# Patient Record
Sex: Female | Born: 1955 | Race: White | Hispanic: No | Marital: Single | Smoking: Former smoker
Health system: Southern US, Community
[De-identification: ages and names within clinical notes are randomized; demographics above are authoritative.]

## PROBLEM LIST (undated history)

## (undated) DIAGNOSIS — E785 Hyperlipidemia, unspecified: Secondary | ICD-10-CM

## (undated) DIAGNOSIS — M1990 Unspecified osteoarthritis, unspecified site: Secondary | ICD-10-CM

## (undated) DIAGNOSIS — E119 Type 2 diabetes mellitus without complications: Secondary | ICD-10-CM

## (undated) DIAGNOSIS — Z860100 Personal history of colon polyps, unspecified: Secondary | ICD-10-CM

## (undated) DIAGNOSIS — J45909 Unspecified asthma, uncomplicated: Secondary | ICD-10-CM

## (undated) DIAGNOSIS — E669 Obesity, unspecified: Secondary | ICD-10-CM

## (undated) HISTORY — PX: KNEE SURGERY: SHX244

## (undated) HISTORY — DX: Obesity, unspecified: E66.9

## (undated) HISTORY — PX: VEIN SURGERY: SHX48

## (undated) HISTORY — DX: Type 2 diabetes mellitus without complications: E11.9

## (undated) HISTORY — DX: Hyperlipidemia, unspecified: E78.5

## (undated) HISTORY — PX: LIPOSUCTION: SHX10

## (undated) HISTORY — PX: BREAST SURGERY: SHX581

## (undated) HISTORY — PX: EYE SURGERY: SHX253

## (undated) HISTORY — DX: Personal history of colon polyps, unspecified: Z86.0100

## (undated) HISTORY — DX: Personal history of colonic polyps: Z86.010

## (undated) HISTORY — PX: COLONOSCOPY: SHX174

---

## 1988-05-26 HISTORY — PX: BREAST EXCISIONAL BIOPSY: SUR124

## 2005-05-13 ENCOUNTER — Ambulatory Visit: Payer: Self-pay | Admitting: Unknown Physician Specialty

## 2006-05-26 HISTORY — PX: POLYPECTOMY: SHX149

## 2006-06-03 ENCOUNTER — Ambulatory Visit: Payer: Self-pay | Admitting: Gastroenterology

## 2006-06-04 ENCOUNTER — Ambulatory Visit: Payer: Self-pay | Admitting: Unknown Physician Specialty

## 2006-07-30 ENCOUNTER — Ambulatory Visit: Payer: Self-pay | Admitting: General Surgery

## 2006-07-30 HISTORY — PX: HEMORROIDECTOMY: SUR656

## 2008-10-04 ENCOUNTER — Ambulatory Visit: Payer: Self-pay | Admitting: Unknown Physician Specialty

## 2009-12-31 ENCOUNTER — Ambulatory Visit: Payer: Self-pay | Admitting: Unknown Physician Specialty

## 2010-01-01 ENCOUNTER — Ambulatory Visit: Payer: Self-pay | Admitting: Unknown Physician Specialty

## 2010-02-01 ENCOUNTER — Ambulatory Visit: Payer: Self-pay | Admitting: General Surgery

## 2010-02-04 LAB — PATHOLOGY REPORT

## 2010-07-22 ENCOUNTER — Ambulatory Visit: Payer: Self-pay

## 2010-12-08 ENCOUNTER — Emergency Department: Payer: Self-pay | Admitting: Emergency Medicine

## 2011-03-19 ENCOUNTER — Ambulatory Visit: Payer: Self-pay | Admitting: Unknown Physician Specialty

## 2012-12-03 ENCOUNTER — Encounter: Payer: Self-pay | Admitting: *Deleted

## 2013-04-13 ENCOUNTER — Encounter (INDEPENDENT_AMBULATORY_CARE_PROVIDER_SITE_OTHER): Payer: Self-pay | Admitting: General Surgery

## 2013-04-13 ENCOUNTER — Ambulatory Visit (INDEPENDENT_AMBULATORY_CARE_PROVIDER_SITE_OTHER): Payer: BC Managed Care – PPO | Admitting: General Surgery

## 2013-04-13 ENCOUNTER — Encounter (INDEPENDENT_AMBULATORY_CARE_PROVIDER_SITE_OTHER): Payer: Self-pay

## 2013-04-13 VITALS — BP 122/80 | HR 68 | Temp 97.6°F | Resp 16 | Ht 64.0 in | Wt 239.2 lb

## 2013-04-19 ENCOUNTER — Encounter (INDEPENDENT_AMBULATORY_CARE_PROVIDER_SITE_OTHER): Payer: Self-pay | Admitting: General Surgery

## 2013-04-19 NOTE — Progress Notes (Signed)
Patient ID: Charlotte Moses, female   DOB: 01/28/1956, 57 y.o.   MRN: [id number] (delayed note entry - taken from notes made during encounter and recollection)  Chief Complaint  Patient presents with  . Weight Loss Surgery    HPI Charlotte Moses is a 57 y.o. female.   HPI 57 year old morbidly obese Caucasian female referred by Dr. Vonita Moss for evaluation of weight loss surgery.The patient is specifically interested in laparoscopic adjustable gastric band surgery because it is the least invasive and reversible. 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 Despite numerous efforts to sustain weight loss she has been unsuccessful. Over the years she has tried Vickey Sages, Doylene Bode, and Weight Watchers all without any long-term success.  Her comorbidities include dyslipidemia, osteoarthritis of both knees, diabetes mellitus.  Past Medical History  Diagnosis Date  . Personal history of colonic polyps     Past Surgical History  Procedure Laterality Date  . Knee surgery Left   . Vein surgery    . Colonoscopy N/A 2008  . Polypectomy  2008    benign    History reviewed. No pertinent family history.  Social History History  Substance Use Topics  . Smoking status: Never Smoker   . Smokeless tobacco: Never Used  . Alcohol Use: Yes    Allergies  Allergen Reactions  . Latex Itching  . Codeine Rash    Current Outpatient Prescriptions  Medication Sig Dispense Refill  . FREESTYLE TEST STRIPS test strip       . KOMBIGLYZE XR 09-998 MG TB24       . pravastatin (PRAVACHOL) 40 MG tablet        No current facility-administered medications for this visit.    Review of Systems Review of Systems  Constitutional: Negative for fever, activity change, appetite change and unexpected weight change.  HENT: Negative for nosebleeds and trouble swallowing.   Eyes: Negative for photophobia and visual disturbance.  Respiratory:  Negative for chest tightness and shortness of breath.   Cardiovascular: Negative for chest pain and leg swelling.       Denies CP, SOB, orthopnea, PND, DOE; h/o elevated BP but BP now normal  Gastrointestinal: Negative for nausea, vomiting, abdominal pain, diarrhea and constipation.       Occasional reflux. 2 prior colonoscopies at 49 & 54 - benign polyps per pt.   Endocrine:       Diagnosed with DM 2 years ago  Genitourinary: Negative for dysuria and difficulty urinating.  Musculoskeletal: Negative for arthralgias.       B/l knee pain - OA in both knees; had L wrist surgery for what she believes was carpal tunnel  Skin: Negative for pallor and rash.  Neurological: Negative for dizziness, seizures, facial asymmetry and numbness.       Denies TIA and amaurosis fugax   Hematological: Negative for adenopathy. Does not bruise/bleed easily.  Psychiatric/Behavioral: Negative for behavioral problems and agitation.    Blood pressure 122/80, pulse 68, temperature 97.6 F (36.4 C), temperature source Temporal, resp. rate 16, height 5\' 4"  (1.626 m), weight 239 lb 3.2 oz (108.5 kg).  Physical Exam Physical Exam  Vitals reviewed. Constitutional: She is oriented to person, place, and time. She appears well-developed and well-nourished. No distress.  Morbidly obese, central truncal obesity  HENT:  Head: Normocephalic and atraumatic.  Right Ear: External ear normal.  Left Ear: External ear normal.  Eyes: Conjunctivae are normal. No scleral icterus.  Neck: Normal range of motion. Neck supple. No tracheal deviation present. No thyromegaly present.  Cardiovascular: Normal rate and normal heart sounds.   Pulmonary/Chest: Effort normal and breath sounds normal. No stridor. No respiratory distress. She has no wheezes.  Musculoskeletal: She exhibits no edema and no tenderness.  Lymphadenopathy:    She has no cervical adenopathy.  Neurological: She is alert and oriented to person, place, and time. She  exhibits normal muscle tone.  Skin: Skin is warm and dry. No rash noted. She is not diaphoretic. No erythema.  Psychiatric: She has a normal mood and affect. Her behavior is normal. Judgment and thought content normal.    Data Reviewed epworth sleepiness score 9  Assessment    Morbid obesity BMI 41.1 Diabetes mellitus Dyslipidemia Bilateral knee osteoarthritis     Plan    The patient meets weight loss surgery criteria. I think the patient would be an acceptable candidate for Laparoscopic adjustable gastric band placement.  We discussed laparoscopic adjustable gastric banding. The patient was given Agricultural engineer. We discussed the risk and benefits of surgery including but not limited to bleeding, infection, injury to surrounding structures, blood clot formation such as deep venous thrombosis or pulmonary embolism, need to convert to an open procedure, band slippage, band erosion, failure to loose weight, port complications (leak or flippage), potential need for reoperative surgery, esophageal dilatation, worsening reflux, and vitamin deficiencies. We discussed the typical post operative recovery course. We discussed that their postoperative diet will be modified for several weeks. We specifically talked about the need to be on a liquid diet for one to 2 weeks after surgery. We also discussed the typical postoperative course with a laparoscopic adjustable gastric band and the need for frequent postoperative visits to assess the volume status of the band.  We discussed the typical expected weight loss with a laparoscopic adjustable gastric band. I explained to the patient that they can expect to lose 40-60% of their excess body weight if they are compliant with their postoperative instructions. However I did explain that some patients loose less than 40% and some patients lose more than 60% of their excess body weight.  I explained that the likelihood of improvement in their obesity is  good.  I explained to the patient that we will start our evaluation process which includes labs, Upper GI to evaluate stomach and swallowing anatomy, nutritionist consultation, psychiatrist consultation, EKG, CXR, abdominal ultrasound, a sleep study since her Epworth Sleepiness scale was borderline at 9.  Mary Sella. Andrey Campanile, MD, FACS General, Bariatric, & Minimally Invasive Surgery [facility], [place]           [facility] M 04/19/2013, 4:47 PM

## 2013-04-29 LAB — COMPREHENSIVE METABOLIC PANEL
ALT: 22 U/L (ref 0–35)
AST: 17 U/L (ref 0–37)
Alkaline Phosphatase: 75 U/L (ref 39–117)
BUN: 15 mg/dL (ref 6–23)
CO2: 27 mEq/L (ref 19–32)
Calcium: 9.4 mg/dL (ref 8.4–10.5)
Chloride: 104 mEq/L (ref 96–112)
Creat: 0.57 mg/dL (ref 0.50–1.10)
Sodium: 139 mEq/L (ref 135–145)
Total Bilirubin: 0.5 mg/dL (ref 0.3–1.2)

## 2013-04-29 LAB — HEMOGLOBIN A1C
Hgb A1c MFr Bld: 7.4 % — ABNORMAL HIGH (ref <5.7)
Mean Plasma Glucose: 166 mg/dL — ABNORMAL HIGH (ref <117)

## 2013-04-29 LAB — LIPID PANEL
HDL: 39 mg/dL — ABNORMAL LOW (ref >39)
Total CHOL/HDL Ratio: 4.4 Ratio
VLDL: 39 mg/dL (ref 0–40)

## 2013-04-29 LAB — CBC WITH DIFFERENTIAL/PLATELET
Basophils Relative: 0 % (ref 0–1)
Eosinophils Absolute: 0.3 10*3/uL (ref 0.0–0.7)
Eosinophils Relative: 3 % (ref 0–5)
HCT: 38.9 % (ref 36.0–46.0)
Hemoglobin: 13.3 g/dL (ref 12.0–15.0)
Lymphs Abs: 1.7 10*3/uL (ref 0.7–4.0)
MCH: 26.8 pg (ref 26.0–34.0)
MCHC: 34.2 g/dL (ref 30.0–36.0)
MCV: 78.4 fL (ref 78.0–100.0)
Monocytes Absolute: 0.8 10*3/uL (ref 0.1–1.0)
Monocytes Relative: 9 % (ref 3–12)
RDW: 13.8 % (ref 11.5–15.5)

## 2013-04-29 LAB — TSH: TSH: 3.579 u[IU]/mL (ref 0.350–4.500)

## 2013-04-29 LAB — PROTIME-INR: Prothrombin Time: 12.8 seconds (ref 11.6–15.2)

## 2013-04-29 LAB — T4: T4, Total: 8.5 ug/dL (ref 5.0–12.5)

## 2013-05-03 ENCOUNTER — Ambulatory Visit (HOSPITAL_COMMUNITY)
Admission: RE | Admit: 2013-05-03 | Discharge: 2013-05-03 | Disposition: A | Discharge status: Home / Self Care | Payer: BC Managed Care – PPO | Source: Ambulatory Visit | Attending: General Surgery | Admitting: General Surgery

## 2013-05-03 ENCOUNTER — Other Ambulatory Visit: Payer: Self-pay

## 2013-05-03 DIAGNOSIS — K449 Diaphragmatic hernia without obstruction or gangrene: Secondary | ICD-10-CM | POA: Insufficient documentation

## 2013-05-03 DIAGNOSIS — Z6841 Body Mass Index (BMI) 40.0 and over, adult: Secondary | ICD-10-CM | POA: Insufficient documentation

## 2013-05-03 DIAGNOSIS — K312 Hourglass stricture and stenosis of stomach: Secondary | ICD-10-CM | POA: Insufficient documentation

## 2013-05-03 DIAGNOSIS — E785 Hyperlipidemia, unspecified: Secondary | ICD-10-CM | POA: Insufficient documentation

## 2013-05-03 DIAGNOSIS — K7689 Other specified diseases of liver: Secondary | ICD-10-CM | POA: Insufficient documentation

## 2013-05-03 DIAGNOSIS — K8020 Calculus of gallbladder without cholecystitis without obstruction: Secondary | ICD-10-CM | POA: Insufficient documentation

## 2013-05-03 DIAGNOSIS — M1710 Unilateral primary osteoarthritis, unspecified knee: Secondary | ICD-10-CM | POA: Insufficient documentation

## 2013-05-03 DIAGNOSIS — E119 Type 2 diabetes mellitus without complications: Secondary | ICD-10-CM | POA: Insufficient documentation

## 2013-05-06 ENCOUNTER — Telehealth (INDEPENDENT_AMBULATORY_CARE_PROVIDER_SITE_OTHER): Payer: Self-pay | Admitting: General Surgery

## 2013-05-06 DIAGNOSIS — R9389 Abnormal findings on diagnostic imaging of other specified body structures: Secondary | ICD-10-CM

## 2013-05-06 NOTE — Telephone Encounter (Signed)
Order placed and given to referral coordinator to set up on Monday.

## 2013-05-06 NOTE — Telephone Encounter (Signed)
Spoke with patient about abnormal chest x-ray. Discussed findings and I recommended getting a CT of the chest with contrast to make sure there is no abnormality. The patient voiced understanding. Explained that our office would contact her to coordinate the scan

## 2013-05-06 NOTE — Addendum Note (Signed)
Addended byLiliana Cline on: 05/06/2013 02:19 PM   Modules accepted: Orders

## 2013-05-09 ENCOUNTER — Telehealth (INDEPENDENT_AMBULATORY_CARE_PROVIDER_SITE_OTHER): Payer: Self-pay | Admitting: *Deleted

## 2013-05-09 NOTE — Telephone Encounter (Signed)
I called pt to inform her of the appt for her CT scan at GI; however, they had already called her about her appt and she rescheduled it for 05/27/13 at 8:30am because she did not want to have to pay her deductible now and then again in January.

## 2013-05-13 ENCOUNTER — Other Ambulatory Visit: Payer: BC Managed Care – PPO

## 2013-05-27 ENCOUNTER — Telehealth (INDEPENDENT_AMBULATORY_CARE_PROVIDER_SITE_OTHER): Payer: Self-pay | Admitting: General Surgery

## 2013-05-27 ENCOUNTER — Ambulatory Visit
Admission: RE | Admit: 2013-05-27 | Discharge: 2013-05-27 | Disposition: A | Discharge status: Home / Self Care | Payer: BC Managed Care – PPO | Source: Ambulatory Visit | Attending: General Surgery | Admitting: General Surgery

## 2013-05-27 DIAGNOSIS — R9389 Abnormal findings on diagnostic imaging of other specified body structures: Secondary | ICD-10-CM

## 2013-05-27 MED ORDER — IOHEXOL 300 MG/ML  SOLN
75.0000 mL | Freq: Once | INTRAMUSCULAR | Status: AC | PRN
  Administered 2013-05-27: 75 mL via INTRAVENOUS

## 2013-05-27 NOTE — Telephone Encounter (Signed)
Pt returned call and was given attached msg. Pt states she understands.

## 2013-05-27 NOTE — Telephone Encounter (Signed)
LMOM asking the pt to return my call. This is so that I may inform her that her CT chest came back and that after having Dr. Redmond Pulling review it he has decided to let her continue her preop evaluation for bariatric surgery but that she will need a 6-12 month CT scan again to f/u on some pulmonary nodules.  He explained that these are very common and its a precautionary test.

## 2013-05-29 ENCOUNTER — Ambulatory Visit (HOSPITAL_BASED_OUTPATIENT_CLINIC_OR_DEPARTMENT_OTHER): Payer: BC Managed Care – PPO | Attending: General Surgery

## 2013-05-29 VITALS — Ht 64.0 in | Wt 235.0 lb

## 2013-05-29 DIAGNOSIS — G4733 Obstructive sleep apnea (adult) (pediatric): Secondary | ICD-10-CM

## 2013-05-29 DIAGNOSIS — R0989 Other specified symptoms and signs involving the circulatory and respiratory systems: Secondary | ICD-10-CM | POA: Insufficient documentation

## 2013-05-29 DIAGNOSIS — R0609 Other forms of dyspnea: Secondary | ICD-10-CM | POA: Insufficient documentation

## 2013-05-29 DIAGNOSIS — G479 Sleep disorder, unspecified: Secondary | ICD-10-CM | POA: Insufficient documentation

## 2013-05-29 DIAGNOSIS — M549 Dorsalgia, unspecified: Secondary | ICD-10-CM | POA: Insufficient documentation

## 2013-06-04 DIAGNOSIS — G4733 Obstructive sleep apnea (adult) (pediatric): Secondary | ICD-10-CM

## 2013-06-04 NOTE — Sleep Study (Addendum)
And a      NAME: Charlotte Moses DATE OF BIRTH:  01-Jul-1955 MEDICAL RECORD NUMBER [id number]  LOCATION: [facility]  PHYSICIAN: Caelan Atchley D  DATE OF STUDY: 05/29/2013  SLEEP STUDY TYPE: Nocturnal Polysomnogram               REFERRING PHYSICIAN: Gayland Curry, MD  INDICATION FOR STUDY: Hypersomnia with sleep apnea  EPWORTH SLEEPINESS SCORE:   8/24 HEIGHT: 5\' 4"  (162.6 cm)  WEIGHT: 106.595 kg (235 lb)    Body mass index is 40.32 kg/(m^2).  NECK SIZE: 15 in.  MEDICATIONS: Charted for review  SLEEP ARCHITECTURE: Total sleep time 33.5 minutes with sleep efficiency 8.3%. Stage I was 41.8%, stage II 58.2%, stage III and REM were absent. Sleep latency 185.5 minutes, awake after sleep onset 161.5 minutes, arousal index 1.8. Bedtime medication: None. The patient complained the bed made her back hurt.  RESPIRATORY DATA: Apnea hypopneas index (AHI) 3.6 per hour. 2 events were scored, one obstructive apnea and 1 hypopneas. Both were associated with non-supine sleep position. She did not qualify for CPAP trial.  OXYGEN DATA: Moderate snoring with oxygen desaturation to a nadir of 86% and mean oxygen saturation through the study of 92% on room air  CARDIAC DATA: Sinus rhythm with occasional PVC and PAC  MOVEMENT/PARASOMNIA: A few limb jerks were counted with no effect on sleep. Bathroom x1.  IMPRESSION/ RECOMMENDATION:   1) Very short total sleep time, inadequate for evaluation. The patient complained of back pain. 2) Only two respiratory events were noted with sleep disturbance, AHI 3.6 per hour, within normal limits. Moderate snoring with oxygen desaturation to a nadir at 86% and mean oxygen saturation through the study of 92% on room air.  Signed Baird Lyons M.D. Deneise Lever Diplomate, American Board of Sleep Medicine  ELECTRONICALLY SIGNED ON:  06/04/2013, 2:57 PM [place] PH: [phone]   FX: [phone] [place]

## 2013-06-06 ENCOUNTER — Encounter: Payer: Self-pay | Admitting: Dietician

## 2013-06-06 ENCOUNTER — Encounter: Payer: BC Managed Care – PPO | Attending: General Surgery | Admitting: Dietician

## 2013-06-06 VITALS — Ht 64.0 in | Wt 238.8 lb

## 2013-06-06 DIAGNOSIS — Z713 Dietary counseling and surveillance: Secondary | ICD-10-CM | POA: Insufficient documentation

## 2013-06-06 NOTE — Patient Instructions (Signed)
Follow Pre-Op goals. Look for and try Protein Shakes.  Call [facility] when surgery is scheduled to enroll in Pre-Op Class.

## 2013-06-06 NOTE — Progress Notes (Signed)
  Pre-Op Assessment Visit:  Pre-Operative LAGB Surgery  Medical Nutrition Therapy:  Appt start time: 3825   End time:  0539.  Patient was seen on 06/06/2013 for Pre-Operative LAGB Nutrition Assessment. Assessment and letter of approval faxed to [facility] Bariatric Surgery Program coordinator on 06/06/2013.   Handouts given during visit include:  Pre-Op Goals Bariatric Surgery Protein Shakes Bariatric Support Group Schedules  Supplements given during visit include:  Premiere Protein, Vanilla, lot # [id number], exp 02/2014 Premiere Protein, Chocolate, lot # [id number], exp 11/2013  Patient to call the [facility] to enroll in Pre-Op and Post-Op Nutrition Education when surgery date is scheduled.

## 2013-07-22 ENCOUNTER — Telehealth (INDEPENDENT_AMBULATORY_CARE_PROVIDER_SITE_OTHER): Payer: Self-pay | Admitting: General Surgery

## 2013-07-22 ENCOUNTER — Other Ambulatory Visit (INDEPENDENT_AMBULATORY_CARE_PROVIDER_SITE_OTHER): Payer: Self-pay | Admitting: General Surgery

## 2013-07-22 NOTE — Telephone Encounter (Signed)
Called patient to let her I will mail out Emmi program to her

## 2013-07-22 NOTE — Telephone Encounter (Signed)
Patient has completed process and has had authorization from her insurance company for laparoscopic sleeve gastrectomy. I contacted the patient to insure this is the procedure that she ended up deciding ongoing with. 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 Will put in the operative orders for laparoscopic sleeve gastrectomy and possible hiatal hernia repair. She will be contacted to give access to her to watch the EMMI video on sleeve gastrectomy

## 2013-08-22 ENCOUNTER — Encounter: Payer: BC Managed Care – PPO | Attending: General Surgery

## 2013-08-22 VITALS — Ht 64.0 in | Wt 239.5 lb

## 2013-08-22 DIAGNOSIS — Z713 Dietary counseling and surveillance: Secondary | ICD-10-CM | POA: Insufficient documentation

## 2013-08-22 NOTE — Patient Instructions (Signed)
Attend class at [facility] 2 weeks post op gastric sleeve.

## 2013-08-22 NOTE — Progress Notes (Signed)
  Pre-Operative Nutrition Class:  Appt start time: 830   End time:  1030.  Patient was seen on 08/22/2013 for Pre-Operative Bariatric Surgery Education at the [facility].   Surgery date: 09/05/2013 Surgery type: Gastric sleeve Start weight at [facility]: 238 on 06/06/2013 Weight today: 239.5 lbs  TANITA  BODY COMP RESULTS  08/22/13   BMI (kg/m^2) 41.1   Fat Mass (lbs) 121.5   Fat Free Mass (lbs) 118   Total Body Water (lbs) 86.5   Samples given per MNT protocol. Patient educated on appropriate usage: Hydrographic surveyor (vanilla) - Qty: 1 Lot #: [id number] Exp: 02/2014  Bariactiv Multivitamin - Qty: 1 Lot #: [id number] S Exp: 09/2014  Bariactiv Calcium Citrate - Qty: 1 Lot #: [id number] S Exp: 10/2014  Unjury Protein Powder (chocolate) - Qty:1 Lot #: [id number] Exp: 08/2014  The following the learning objectives were met by the patient during this course:  Identify Pre-Op Dietary Goals and will begin 2 weeks pre-operatively  Identify appropriate sources of fluids and proteins   State protein recommendations and appropriate sources pre and post-operatively  Identify Post-Operative Dietary Goals and will follow for 2 weeks post-operatively  Identify appropriate multivitamin and calcium sources  Describe the need for physical activity post-operatively and will follow MD recommendations  State when to call healthcare provider regarding medication questions or post-operative complications  Handouts given during class include:  Pre-Op Bariatric Surgery Diet Handout  Protein Shake Handout  Post-Op Bariatric Surgery Nutrition Handout  BELT Program Information Flyer  Support Group Information Flyer  [facility] Bariatric Supplements Price List  Follow-Up Plan: Patient will follow-up at [facility] 2 weeks post operatively for diet advancement per MD.

## 2013-08-23 ENCOUNTER — Encounter (HOSPITAL_COMMUNITY): Payer: Self-pay | Admitting: Pharmacy Technician

## 2013-08-29 ENCOUNTER — Encounter (INDEPENDENT_AMBULATORY_CARE_PROVIDER_SITE_OTHER): Payer: Self-pay

## 2013-08-29 ENCOUNTER — Encounter (HOSPITAL_COMMUNITY): Payer: Self-pay

## 2013-08-29 ENCOUNTER — Encounter (HOSPITAL_COMMUNITY)
Admission: RE | Admit: 2013-08-29 | Discharge: 2013-08-29 | Disposition: A | Discharge status: Home / Self Care | Payer: BC Managed Care – PPO | Source: Ambulatory Visit | Attending: General Surgery | Admitting: General Surgery

## 2013-08-29 DIAGNOSIS — Z01812 Encounter for preprocedural laboratory examination: Secondary | ICD-10-CM | POA: Insufficient documentation

## 2013-08-29 HISTORY — DX: Unspecified osteoarthritis, unspecified site: M19.90

## 2013-08-29 HISTORY — DX: Unspecified asthma, uncomplicated: J45.909

## 2013-08-29 LAB — CBC WITH DIFFERENTIAL/PLATELET
BASOS PCT: 0 % (ref 0–1)
Basophils Absolute: 0 10*3/uL (ref 0.0–0.1)
EOS ABS: 0.2 10*3/uL (ref 0.0–0.7)
Eosinophils Relative: 2 % (ref 0–5)
HCT: 41.3 % (ref 36.0–46.0)
HEMOGLOBIN: 13.6 g/dL (ref 12.0–15.0)
Lymphocytes Relative: 20 % (ref 12–46)
Lymphs Abs: 2.1 10*3/uL (ref 0.7–4.0)
MCH: 27.4 pg (ref 26.0–34.0)
MCHC: 32.9 g/dL (ref 30.0–36.0)
MCV: 83.1 fL (ref 78.0–100.0)
MONO ABS: 0.8 10*3/uL (ref 0.1–1.0)
MONOS PCT: 7 % (ref 3–12)
NEUTROS PCT: 71 % (ref 43–77)
Neutro Abs: 7.4 10*3/uL (ref 1.7–7.7)
Platelets: 329 10*3/uL (ref 150–400)
RBC: 4.97 MIL/uL (ref 3.87–5.11)
RDW: 13.6 % (ref 11.5–15.5)
WBC: 10.4 10*3/uL (ref 4.0–10.5)

## 2013-08-29 LAB — COMPREHENSIVE METABOLIC PANEL
ALT: 37 U/L — ABNORMAL HIGH (ref 0–35)
AST: 33 U/L (ref 0–37)
Albumin: 4.1 g/dL (ref 3.5–5.2)
Alkaline Phosphatase: 87 U/L (ref 39–117)
BUN: 24 mg/dL — AB (ref 6–23)
CO2: 23 mEq/L (ref 19–32)
CREATININE: 0.53 mg/dL (ref 0.50–1.10)
Calcium: 9.5 mg/dL (ref 8.4–10.5)
Chloride: 102 mEq/L (ref 96–112)
GFR calc Af Amer: >90 mL/min (ref >90)
GFR calc non Af Amer: >90 mL/min (ref >90)
Glucose, Bld: 104 mg/dL — ABNORMAL HIGH (ref 70–99)
Potassium: 4.2 mEq/L (ref 3.7–5.3)
Sodium: 138 mEq/L (ref 137–147)
TOTAL PROTEIN: 7.2 g/dL (ref 6.0–8.3)
Total Bilirubin: 0.5 mg/dL (ref 0.3–1.2)

## 2013-08-29 NOTE — Progress Notes (Signed)
CT chest 05/27/13 on EPIC, EKG 05/03/13 on EPIC

## 2013-08-29 NOTE — Patient Instructions (Addendum)
[place]  08/29/2013   Your procedure is scheduled on: 09/05/13  Report to [facility] at 5:30 AM.  Call this number if you have problems the morning of surgery 336-: [phone]   Remember:   Do not eat food or drink liquids After Midnight.    Do not wear jewelry, make-up or nail polish.  Do not wear lotions, powders, or perfumes. You may wear deodorant.  Do not shave 48 hours prior to surgery. Men may shave face and neck.  Do not bring valuables to the hospital.  Contacts, dentures or bridgework may not be worn into surgery.  Leave suitcase in the car. After surgery it may be brought to your room.  For patients admitted to the hospital, checkout time is 11:00 AM the day of discharge.    Please read over the following fact sheets that you were given: preparing for surgery sheet Paulette Blanch, RN  pre op nurse call if needed [phone]    FAILURE TO [place]   Patient Signature: ___________________________________________

## 2013-08-30 ENCOUNTER — Telehealth (INDEPENDENT_AMBULATORY_CARE_PROVIDER_SITE_OTHER): Payer: Self-pay

## 2013-08-31 ENCOUNTER — Telehealth (INDEPENDENT_AMBULATORY_CARE_PROVIDER_SITE_OTHER): Payer: Self-pay | Admitting: General Surgery

## 2013-08-31 ENCOUNTER — Ambulatory Visit (INDEPENDENT_AMBULATORY_CARE_PROVIDER_SITE_OTHER): Payer: BC Managed Care – PPO | Admitting: General Surgery

## 2013-08-31 NOTE — Telephone Encounter (Signed)
Called patient this morning and I asked her if she was doing her prep-op Bariatric diet and she stated yes she is

## 2013-09-01 NOTE — Telephone Encounter (Signed)
Erroneous encounter

## 2013-09-02 ENCOUNTER — Encounter (INDEPENDENT_AMBULATORY_CARE_PROVIDER_SITE_OTHER): Payer: Self-pay | Admitting: General Surgery

## 2013-09-02 ENCOUNTER — Ambulatory Visit (INDEPENDENT_AMBULATORY_CARE_PROVIDER_SITE_OTHER): Payer: BC Managed Care – PPO | Admitting: General Surgery

## 2013-09-02 VITALS — BP 130/74 | HR 77 | Temp 97.5°F | Resp 16 | Ht 64.0 in | Wt 230.8 lb

## 2013-09-02 DIAGNOSIS — E66813 Obesity, class 3: Secondary | ICD-10-CM

## 2013-09-02 MED ORDER — OXYCODONE HCL 5 MG/5ML PO SOLN: 5.0000 mg | ORAL | Status: DC | PRN

## 2013-09-02 NOTE — Patient Instructions (Signed)
Get prescription filled Keep up with preop diet

## 2013-09-02 NOTE — Progress Notes (Signed)
Patient ID: Charlotte Moses, female   DOB: 1955/06/25, 58 y.o.   MRN: [id number]  Chief Complaint  Patient presents with  . Bariatric Pre-op    HPI Charlotte Moses is a 58 y.o. female.   HPI 58 year old obese Caucasian female comes in today for her preoperative appointment. I initially saw her in November 2014. At that time her weight was 239 pounds. She was initially interested in laparoscopic adjustable gastric band surgery; however, she decided on going with a sleeve gastrectomy due to her work schedule. She is currently scheduled for surgery this coming Monday. She has started her preoperative diet. She denies any changes since I saw her in the fall. She denies any new medications or allergies. She did watch the EMMI video on sleeve gastrectomy. Past Medical History  Diagnosis Date  . Personal history of colonic polyps   . Diabetes mellitus without complication   . Hyperlipidemia   . Obesity   . Asthma     exercise induced  . Arthritis     knees    Past Surgical History  Procedure Laterality Date  . Knee surgery Left ~25 years ago  . Vein surgery Right more than 10 years ago    varicose veins  . Colonoscopy N/A 2008  . Polypectomy  2008    benign  . Hemorroidectomy  ~5 years ago  . Eye surgery Bilateral ~10 years ago    corrective    Family History  Problem Relation Age of Onset  . Hyperlipidemia Other   . Hypertension Other   . Obesity Other   . Ulcers Other     Social History History  Substance Use Topics  . Smoking status: Former Smoker -- 20 years    Types: Cigarettes    Quit date: 05/26/1988  . Smokeless tobacco: Never Used  . Alcohol Use: Yes     Comment: occasional    Allergies  Allergen Reactions  . Latex Itching  . Codeine Rash    Current Outpatient Prescriptions  Medication Sig Dispense Refill  . CINNAMON PO Take 1 tablet by mouth daily.      . Coenzyme Q10 (COQ10 PO) Take 1 tablet by mouth daily.      Marland Kitchen FREESTYLE TEST STRIPS test strip       .  metFORMIN (GLUCOPHAGE) 500 MG tablet       . Multiple Vitamin (MULTI VITAMIN DAILY PO) Take 1 tablet by mouth daily. occuvite      . Multiple Vitamin (MULTIVITAMIN) tablet Take 1 tablet by mouth daily.      . pravastatin (PRAVACHOL) 40 MG tablet Take 40 mg by mouth daily.       Marland Kitchen oxyCODONE (ROXICODONE) 5 MG/5ML solution Take 5-10 mLs (5-10 mg total) by mouth every 4 (four) hours as needed for severe pain.  200 mL  0   No current facility-administered medications for this visit.    Review of Systems Review of Systems  Constitutional: Negative for fever, activity change, appetite change and unexpected weight change.  HENT: Negative for nosebleeds and trouble swallowing.   Eyes: Negative for photophobia and visual disturbance.  Respiratory: Negative for chest tightness and shortness of breath.   Cardiovascular: Negative for chest pain and leg swelling.       Denies CP, SOB, orthopnea, PND, DOE  Genitourinary: Negative for dysuria and difficulty urinating.  Musculoskeletal: Negative for arthralgias.       Osteoarthritis of both knees  Skin: Negative for pallor and rash.  Neurological: Negative for dizziness,  seizures, facial asymmetry and numbness.       Denies TIA and amaurosis fugax   Hematological: Negative for adenopathy. Does not bruise/bleed easily.  Psychiatric/Behavioral: Negative for behavioral problems and agitation.    Blood pressure 130/74, pulse 77, temperature 97.5 F (36.4 C), temperature source Oral, resp. rate 16, height 5\' 4"  (1.626 m), weight 230 lb 12.8 oz (104.69 kg).  Physical Exam Physical Exam  Vitals reviewed. Constitutional: She is oriented to person, place, and time. She appears well-developed and well-nourished. No distress.  Apple shape  HENT:  Head: Normocephalic and atraumatic.  Right Ear: External ear normal.  Left Ear: External ear normal.  Eyes: Conjunctivae are normal. No scleral icterus.  Neck: Normal range of motion. Neck supple. No tracheal  deviation present. No thyromegaly present.  Cardiovascular: Normal rate, normal heart sounds and intact distal pulses.   Pulmonary/Chest: Effort normal and breath sounds normal. No respiratory distress. She has no wheezes.  Abdominal: Soft. She exhibits no distension. There is no tenderness. There is no rebound and no guarding.  Musculoskeletal: Normal range of motion. She exhibits no edema and no tenderness.  Lymphadenopathy:    She has no cervical adenopathy.  Neurological: She is alert and oriented to person, place, and time. She exhibits normal muscle tone.  Skin: Skin is warm and dry. No rash noted. She is not diaphoretic. No erythema. No pallor.  Psychiatric: She has a normal mood and affect. Her behavior is normal. Judgment and thought content normal.    Data Reviewed My office note November 2014 Initial evaluation labs: Hemoglobin A1c 7.4; triglyceride level 195, HDL 39 Abdominal ultrasound-as the liver Upper GI tiny hiatal hernia CT chest-lung nodules, recommend CT followup Sleep evaluation-unremarkable  Assessment    Obesity BMI 39.62 Osteoarthritis Dyslipidemia Diabetes mellitus type 2 Lung nodules Fatty liver Possible small hiatal hernia     Plan    We reviewed her workup to date including her radiological imaging. We discussed that she will need a followup CT scan sometime in the late summer or early fall. She has started her preoperative diet. We discussed the importance of that to help shrink the liver. She was given her prescription for pain medication today.  We discussed laparoscopic sleeve gastrectomy. We discussed the preoperative, operative and postoperative process. Using diagrams, I explained the surgery in detail including the performance of an EGD near the end of the surgery and an Upper GI swallow study on POD 1. We discussed the typical hospital course including a 2-3 day stay baring any complications.   The patient was given educational material. I  quoted the patient that most patients can lose up to 50-70% of their excess weight. We did discuss the possibility of weight regain several years after the procedure.  The risks of infection, bleeding, pain, scarring, weight regain, too little or too much weight loss, vitamin deficiencies and need for lifelong vitamin supplementation, hair loss, need for protein supplementation, leaks, stricture, reflux, food intolerance, gallstone formation, hernia, need for reoperation and conversion to roux Y gastric bypass, need for open surgery, injury to spleen or surrounding structures, DVT's, PE, and death again discussed with the patient and the patient expressed understanding and desires to proceed with laparoscopic vertical sleeve gastrectomy, possible open, intraoperative endoscopy.   Leighton Ruff. Redmond Pulling, MD, FACS General, Bariatric, & Minimally Invasive Surgery [facility], [place]'       Gayland Curry 09/02/2013, 6:08 PM

## 2013-09-04 ENCOUNTER — Encounter (HOSPITAL_COMMUNITY): Payer: Self-pay | Admitting: Anesthesiology

## 2013-09-04 NOTE — Anesthesia Preprocedure Evaluation (Signed)
Anesthesia Evaluation  Patient identified by MRN, date of birth, ID band Patient awake    Reviewed: Allergy & Precautions, H&P , NPO status , Patient's Chart, lab work & pertinent test results  Airway Mallampati: II TM Distance: >3 FB Neck ROM: Full    Dental no notable dental hx.    Pulmonary asthma , former smoker,  breath sounds clear to auscultation  Pulmonary exam normal       Cardiovascular negative cardio ROS  Rhythm:Regular Rate:Normal     Neuro/Psych negative neurological ROS  negative psych ROS   GI/Hepatic negative GI ROS, Neg liver ROS,   Endo/Other  negative endocrine ROSdiabetes, Type 2, Oral Hypoglycemic Agents  Renal/GU negative Renal ROS  negative genitourinary   Musculoskeletal negative musculoskeletal ROS (+)   Abdominal   Peds negative pediatric ROS (+)  Hematology negative hematology ROS (+)   Anesthesia Other Findings   Reproductive/Obstetrics negative OB ROS                           Anesthesia Physical Anesthesia Plan  ASA: III  Anesthesia Plan: General   Post-op Pain Management:    Induction: Intravenous  Airway Management Planned: Oral ETT  Additional Equipment:   Intra-op Plan:   Post-operative Plan: Extubation in OR  Informed Consent: I have reviewed the patients History and Physical, chart, labs and discussed the procedure including the risks, benefits and alternatives for the proposed anesthesia with the patient or authorized representative who has indicated his/her understanding and acceptance.   Dental advisory given  Plan Discussed with: CRNA  Anesthesia Plan Comments:         Anesthesia Quick Evaluation

## 2013-09-05 ENCOUNTER — Encounter (HOSPITAL_COMMUNITY)
Admission: RE | Disposition: A | Discharge status: Home / Self Care | Payer: Self-pay | Source: Ambulatory Visit | Attending: General Surgery

## 2013-09-05 ENCOUNTER — Inpatient Hospital Stay (HOSPITAL_COMMUNITY)
Admission: RE | Admit: 2013-09-05 | Discharge: 2013-09-07 | DRG: 621 | Disposition: A | Discharge status: Home / Self Care | Payer: BC Managed Care – PPO | Source: Ambulatory Visit | Attending: General Surgery | Admitting: General Surgery

## 2013-09-05 ENCOUNTER — Encounter (HOSPITAL_COMMUNITY): Payer: BC Managed Care – PPO | Admitting: Anesthesiology

## 2013-09-05 ENCOUNTER — Inpatient Hospital Stay (HOSPITAL_COMMUNITY): Payer: BC Managed Care – PPO | Admitting: Anesthesiology

## 2013-09-05 ENCOUNTER — Encounter (HOSPITAL_COMMUNITY): Payer: Self-pay

## 2013-09-05 DIAGNOSIS — Z860100 Personal history of colon polyps, unspecified: Secondary | ICD-10-CM

## 2013-09-05 DIAGNOSIS — E118 Type 2 diabetes mellitus with unspecified complications: Secondary | ICD-10-CM

## 2013-09-05 DIAGNOSIS — Z6839 Body mass index (BMI) 39.0-39.9, adult: Secondary | ICD-10-CM

## 2013-09-05 DIAGNOSIS — Z87891 Personal history of nicotine dependence: Secondary | ICD-10-CM

## 2013-09-05 DIAGNOSIS — K7689 Other specified diseases of liver: Secondary | ICD-10-CM | POA: Diagnosis present

## 2013-09-05 DIAGNOSIS — E669 Obesity, unspecified: Secondary | ICD-10-CM

## 2013-09-05 DIAGNOSIS — E785 Hyperlipidemia, unspecified: Secondary | ICD-10-CM | POA: Diagnosis present

## 2013-09-05 DIAGNOSIS — K449 Diaphragmatic hernia without obstruction or gangrene: Secondary | ICD-10-CM | POA: Diagnosis present

## 2013-09-05 DIAGNOSIS — E1165 Type 2 diabetes mellitus with hyperglycemia: Secondary | ICD-10-CM | POA: Diagnosis present

## 2013-09-05 DIAGNOSIS — Z9884 Bariatric surgery status: Secondary | ICD-10-CM

## 2013-09-05 DIAGNOSIS — R918 Other nonspecific abnormal finding of lung field: Secondary | ICD-10-CM | POA: Diagnosis present

## 2013-09-05 DIAGNOSIS — IMO0002 Reserved for concepts with insufficient information to code with codable children: Secondary | ICD-10-CM | POA: Diagnosis present

## 2013-09-05 DIAGNOSIS — Z01812 Encounter for preprocedural laboratory examination: Secondary | ICD-10-CM

## 2013-09-05 DIAGNOSIS — E119 Type 2 diabetes mellitus without complications: Secondary | ICD-10-CM

## 2013-09-05 DIAGNOSIS — J45909 Unspecified asthma, uncomplicated: Secondary | ICD-10-CM | POA: Diagnosis present

## 2013-09-05 DIAGNOSIS — M1990 Unspecified osteoarthritis, unspecified site: Secondary | ICD-10-CM | POA: Diagnosis present

## 2013-09-05 HISTORY — PX: LAPAROSCOPIC GASTRIC SLEEVE RESECTION: SHX5895

## 2013-09-05 LAB — GLUCOSE, CAPILLARY
GLUCOSE-CAPILLARY: 120 mg/dL — AB (ref 70–99)
Glucose-Capillary: 102 mg/dL — ABNORMAL HIGH (ref 70–99)
Glucose-Capillary: 111 mg/dL — ABNORMAL HIGH (ref 70–99)
Glucose-Capillary: 133 mg/dL — ABNORMAL HIGH (ref 70–99)
Glucose-Capillary: 137 mg/dL — ABNORMAL HIGH (ref 70–99)

## 2013-09-05 LAB — HEMOGLOBIN AND HEMATOCRIT, BLOOD
HEMATOCRIT: 38.9 % (ref 36.0–46.0)
HEMOGLOBIN: 12.7 g/dL (ref 12.0–15.0)

## 2013-09-05 SURGERY — GASTRECTOMY, SLEEVE, LAPAROSCOPIC
Anesthesia: General

## 2013-09-05 MED ORDER — FAMOTIDINE IN NACL 20-0.9 MG/50ML-% IV SOLN
20.0000 mg | Freq: Two times a day (BID) | INTRAVENOUS | Status: DC
  Administered 2013-09-05 – 2013-09-06 (×4): 20 mg via INTRAVENOUS
  Filled 2013-09-05 (×6): qty 50

## 2013-09-05 MED ORDER — PROMETHAZINE HCL 25 MG/ML IJ SOLN: 6.2500 mg | INTRAMUSCULAR | Status: DC | PRN

## 2013-09-05 MED ORDER — FENTANYL CITRATE 0.05 MG/ML IJ SOLN: 25.0000 ug | INTRAMUSCULAR | Status: DC | PRN

## 2013-09-05 MED ORDER — ENOXAPARIN SODIUM 40 MG/0.4ML ~~LOC~~ SOLN
40.0000 mg | Freq: Two times a day (BID) | SUBCUTANEOUS | Status: DC
  Administered 2013-09-06 – 2013-09-07 (×3): 40 mg via SUBCUTANEOUS
  Filled 2013-09-05 (×5): qty 0.4

## 2013-09-05 MED ORDER — ONDANSETRON HCL 4 MG/2ML IJ SOLN
INTRAMUSCULAR | Status: DC | PRN
  Administered 2013-09-05: 4 mg via INTRAVENOUS

## 2013-09-05 MED ORDER — DEXTROSE 5 % IV SOLN
INTRAVENOUS | Status: AC
  Filled 2013-09-05: qty 2

## 2013-09-05 MED ORDER — SUFENTANIL CITRATE 50 MCG/ML IV SOLN
INTRAVENOUS | Status: DC | PRN
  Administered 2013-09-05 (×3): 10 ug via INTRAVENOUS
  Administered 2013-09-05 (×3): 5 ug via INTRAVENOUS
  Administered 2013-09-05: 10 ug via INTRAVENOUS
  Administered 2013-09-05: 15 ug via INTRAVENOUS
  Administered 2013-09-05 (×2): 10 ug via INTRAVENOUS

## 2013-09-05 MED ORDER — MIDAZOLAM HCL 5 MG/5ML IJ SOLN
INTRAMUSCULAR | Status: DC | PRN
  Administered 2013-09-05: 2 mg via INTRAVENOUS

## 2013-09-05 MED ORDER — SUCCINYLCHOLINE CHLORIDE 20 MG/ML IJ SOLN
INTRAMUSCULAR | Status: DC | PRN
  Administered 2013-09-05: 100 mg via INTRAVENOUS

## 2013-09-05 MED ORDER — BUPIVACAINE-EPINEPHRINE 0.25% -1:200000 IJ SOLN
INTRAMUSCULAR | Status: DC | PRN
  Administered 2013-09-05: 30 mL

## 2013-09-05 MED ORDER — UNJURY VANILLA POWDER: 2.0000 [oz_av] | Freq: Four times a day (QID) | ORAL | Status: DC

## 2013-09-05 MED ORDER — TISSEEL VH 10 ML EX KIT
PACK | CUTANEOUS | Status: AC
  Filled 2013-09-05: qty 1

## 2013-09-05 MED ORDER — ROCURONIUM BROMIDE 100 MG/10ML IV SOLN
INTRAVENOUS | Status: AC
  Filled 2013-09-05: qty 1

## 2013-09-05 MED ORDER — LIDOCAINE HCL (CARDIAC) 20 MG/ML IV SOLN
INTRAVENOUS | Status: DC | PRN
  Administered 2013-09-05: 75 mg via INTRAVENOUS
  Administered 2013-09-05: 25 mg via INTRAVENOUS

## 2013-09-05 MED ORDER — MIDAZOLAM HCL 2 MG/2ML IJ SOLN
INTRAMUSCULAR | Status: AC
  Filled 2013-09-05: qty 2

## 2013-09-05 MED ORDER — HEPARIN SODIUM (PORCINE) 5000 UNIT/ML IJ SOLN
5000.0000 [IU] | INTRAMUSCULAR | Status: AC
Start: 2013-09-05 — End: 2013-09-05
  Administered 2013-09-05: 5000 [IU] via SUBCUTANEOUS
  Filled 2013-09-05: qty 1

## 2013-09-05 MED ORDER — INSULIN ASPART 100 UNIT/ML ~~LOC~~ SOLN
0.0000 [IU] | SUBCUTANEOUS | Status: DC
  Administered 2013-09-05: 2 [IU] via SUBCUTANEOUS

## 2013-09-05 MED ORDER — PROPOFOL 10 MG/ML IV BOLUS
INTRAVENOUS | Status: AC
  Filled 2013-09-05: qty 20

## 2013-09-05 MED ORDER — PROPOFOL 10 MG/ML IV BOLUS
INTRAVENOUS | Status: DC | PRN
  Administered 2013-09-05: 270 mg via INTRAVENOUS
  Administered 2013-09-05 (×2): 50 mg via INTRAVENOUS

## 2013-09-05 MED ORDER — BUPIVACAINE-EPINEPHRINE PF 0.25-1:200000 % IJ SOLN
INTRAMUSCULAR | Status: AC
  Filled 2013-09-05: qty 30

## 2013-09-05 MED ORDER — ONDANSETRON HCL 4 MG/2ML IJ SOLN
4.0000 mg | INTRAMUSCULAR | Status: DC | PRN
  Administered 2013-09-06: 4 mg via INTRAVENOUS
  Filled 2013-09-05: qty 2

## 2013-09-05 MED ORDER — ONDANSETRON HCL 4 MG/2ML IJ SOLN
INTRAMUSCULAR | Status: AC
  Filled 2013-09-05: qty 2

## 2013-09-05 MED ORDER — CHLORHEXIDINE GLUCONATE 4 % EX LIQD: 60.0000 mL | Freq: Once | CUTANEOUS | Status: DC

## 2013-09-05 MED ORDER — LACTATED RINGERS IR SOLN
Status: DC | PRN
Start: 2013-09-05 — End: 2013-09-05
  Administered 2013-09-05: 3000 mL

## 2013-09-05 MED ORDER — LACTATED RINGERS IV SOLN
INTRAVENOUS | Status: DC | PRN
  Administered 2013-09-05 (×3): via INTRAVENOUS

## 2013-09-05 MED ORDER — GLYCOPYRROLATE 0.2 MG/ML IJ SOLN
INTRAMUSCULAR | Status: AC
  Filled 2013-09-05: qty 1

## 2013-09-05 MED ORDER — SODIUM CHLORIDE 0.9 % IJ SOLN
INTRAMUSCULAR | Status: AC
  Filled 2013-09-05: qty 20

## 2013-09-05 MED ORDER — SUFENTANIL CITRATE 50 MCG/ML IV SOLN
INTRAVENOUS | Status: AC
  Filled 2013-09-05: qty 1

## 2013-09-05 MED ORDER — ACETAMINOPHEN 160 MG/5ML PO SOLN
325.0000 mg | ORAL | Status: DC | PRN
Start: 2013-09-06 — End: 2013-09-07

## 2013-09-05 MED ORDER — PROPOFOL 10 MG/ML IV BOLUS
INTRAVENOUS | Status: AC
Start: 2013-09-05 — End: 2013-09-05
  Filled 2013-09-05: qty 20

## 2013-09-05 MED ORDER — DEXAMETHASONE SODIUM PHOSPHATE 10 MG/ML IJ SOLN
INTRAMUSCULAR | Status: DC | PRN
  Administered 2013-09-05: 10 mg via INTRAVENOUS

## 2013-09-05 MED ORDER — STERILE WATER FOR IRRIGATION IR SOLN
Status: DC | PRN
  Administered 2013-09-05: 1500 mL

## 2013-09-05 MED ORDER — UNJURY CHOCOLATE CLASSIC POWDER
2.0000 [oz_av] | Freq: Four times a day (QID) | ORAL | Status: DC
Start: 2013-09-07 — End: 2013-09-07
  Administered 2013-09-07: 2 [oz_av] via ORAL

## 2013-09-05 MED ORDER — ACETAMINOPHEN 160 MG/5ML PO SOLN: 650.0000 mg | ORAL | Status: DC | PRN

## 2013-09-05 MED ORDER — DEXTROSE 5 % IV SOLN: 2.0000 g | INTRAVENOUS | Status: DC

## 2013-09-05 MED ORDER — MORPHINE SULFATE 2 MG/ML IJ SOLN
2.0000 mg | INTRAMUSCULAR | Status: DC | PRN
  Administered 2013-09-05 – 2013-09-06 (×4): 2 mg via INTRAVENOUS
  Administered 2013-09-06 (×2): 4 mg via INTRAVENOUS
  Filled 2013-09-05: qty 2
  Filled 2013-09-05 (×3): qty 1
  Filled 2013-09-05: qty 2
  Filled 2013-09-05: qty 1

## 2013-09-05 MED ORDER — POTASSIUM CHLORIDE IN NACL 20-0.45 MEQ/L-% IV SOLN
INTRAVENOUS | Status: DC
  Administered 2013-09-05 – 2013-09-07 (×5): via INTRAVENOUS
  Filled 2013-09-05 (×8): qty 1000

## 2013-09-05 MED ORDER — SUCCINYLCHOLINE CHLORIDE 20 MG/ML IJ SOLN
INTRAMUSCULAR | Status: AC
  Filled 2013-09-05: qty 1

## 2013-09-05 MED ORDER — TISSEEL VH 10 ML EX KIT
PACK | CUTANEOUS | Status: DC | PRN
  Administered 2013-09-05: 10 mL

## 2013-09-05 MED ORDER — UNJURY CHICKEN SOUP POWDER: 2.0000 [oz_av] | Freq: Four times a day (QID) | ORAL | Status: DC

## 2013-09-05 MED ORDER — 0.9 % SODIUM CHLORIDE (POUR BTL) OPTIME
TOPICAL | Status: DC | PRN
  Administered 2013-09-05: 1000 mL

## 2013-09-05 MED ORDER — NEOSTIGMINE METHYLSULFATE 1 MG/ML IJ SOLN
INTRAMUSCULAR | Status: AC
  Filled 2013-09-05: qty 10

## 2013-09-05 MED ORDER — GLYCOPYRROLATE 0.2 MG/ML IJ SOLN
INTRAMUSCULAR | Status: DC | PRN
  Administered 2013-09-05: .6 mg via INTRAVENOUS
  Administered 2013-09-05: 0.2 mg via INTRAVENOUS

## 2013-09-05 MED ORDER — NEOSTIGMINE METHYLSULFATE 1 MG/ML IJ SOLN
INTRAMUSCULAR | Status: DC | PRN
  Administered 2013-09-05: 4 mg via INTRAVENOUS

## 2013-09-05 MED ORDER — LIDOCAINE HCL (CARDIAC) 20 MG/ML IV SOLN
INTRAVENOUS | Status: AC
  Filled 2013-09-05: qty 5

## 2013-09-05 MED ORDER — GLYCOPYRROLATE 0.2 MG/ML IJ SOLN
INTRAMUSCULAR | Status: AC
  Filled 2013-09-05: qty 3

## 2013-09-05 MED ORDER — ROCURONIUM BROMIDE 100 MG/10ML IV SOLN
INTRAVENOUS | Status: DC | PRN
  Administered 2013-09-05: 10 mg via INTRAVENOUS
  Administered 2013-09-05: 20 mg via INTRAVENOUS
  Administered 2013-09-05: 5 mg via INTRAVENOUS
  Administered 2013-09-05: 45 mg via INTRAVENOUS

## 2013-09-05 MED ORDER — OXYCODONE HCL 5 MG/5ML PO SOLN
5.0000 mg | ORAL | Status: DC | PRN
  Administered 2013-09-06 – 2013-09-07 (×5): 5 mg via ORAL
  Filled 2013-09-05 (×5): qty 5

## 2013-09-05 SURGICAL SUPPLY — 60 items
APPLICATOR COTTON TIP 6IN STRL (MISCELLANEOUS) IMPLANT
APPLIER CLIP ROT 10 11.4 M/L (STAPLE)
BLADE SURG SZ11 CARB STEEL (BLADE) ×3 IMPLANT
CABLE HIGH FREQUENCY MONO STRZ (ELECTRODE) IMPLANT
CANISTER SUCTION 2500CC (MISCELLANEOUS) ×3 IMPLANT
CHLORAPREP W/TINT 26ML (MISCELLANEOUS) ×6 IMPLANT
CLIP APPLIE ROT 10 11.4 M/L (STAPLE) IMPLANT
DERMABOND ADVANCED (GAUZE/BANDAGES/DRESSINGS) ×2
DERMABOND ADVANCED .7 DNX12 (GAUZE/BANDAGES/DRESSINGS) ×1 IMPLANT
DEVICE SUT QUICK LOAD TK 5 (STAPLE) ×2 IMPLANT
DEVICE SUT TI-KNOT TK 5X26 (MISCELLANEOUS) ×2 IMPLANT
DEVICE SUTURE ENDOST 10MM (ENDOMECHANICALS) ×3 IMPLANT
DEVICE TI KNOT TK5 (MISCELLANEOUS) ×1
DEVICE TROCAR PUNCTURE CLOSURE (ENDOMECHANICALS) IMPLANT
DISSECTOR BLUNT TIP ENDO 5MM (MISCELLANEOUS) IMPLANT
DRAPE CAMERA CLOSED 9X96 (DRAPES) ×3 IMPLANT
DRAPE UTILITY XL STRL (DRAPES) ×6 IMPLANT
ELECT REM PT RETURN 9FT ADLT (ELECTROSURGICAL) ×3
ELECTRODE REM PT RTRN 9FT ADLT (ELECTROSURGICAL) ×1 IMPLANT
GLOVE BIOGEL M STRL SZ7.5 (GLOVE) IMPLANT
GLOVE BIOGEL PI IND STRL 7.0 (GLOVE) ×2 IMPLANT
GLOVE BIOGEL PI IND STRL 7.5 (GLOVE) ×2 IMPLANT
GLOVE BIOGEL PI INDICATOR 7.0 (GLOVE) ×4
GLOVE BIOGEL PI INDICATOR 7.5 (GLOVE) ×4
GOWN STRL REUS W/TWL XL LVL3 (GOWN DISPOSABLE) ×15 IMPLANT
HEMOSTAT SURGICEL 4X8 (HEMOSTASIS) ×3 IMPLANT
HOVERMATT SINGLE USE (MISCELLANEOUS) ×3 IMPLANT
KIT BASIN OR (CUSTOM PROCEDURE TRAY) ×3 IMPLANT
MARKER SKIN DUAL TIP RULER LAB (MISCELLANEOUS) ×3 IMPLANT
NEEDLE SPNL 22GX3.5 QUINCKE BK (NEEDLE) ×3 IMPLANT
NS IRRIG 1000ML POUR BTL (IV SOLUTION) ×3 IMPLANT
PACK UNIVERSAL I (CUSTOM PROCEDURE TRAY) ×3 IMPLANT
PENCIL BUTTON HOLSTER BLD 10FT (ELECTRODE) ×3 IMPLANT
POUCH SPECIMEN RETRIEVAL 10MM (ENDOMECHANICALS) IMPLANT
QUICK LOAD TK 5 (STAPLE) ×1
RELOAD BLUE (STAPLE) ×21 IMPLANT
RELOAD GOLD (STAPLE) ×6 IMPLANT
RELOAD GREEN (STAPLE) IMPLANT
SCISSORS LAP 5X35 DISP (ENDOMECHANICALS) IMPLANT
SCISSORS LAP 5X45 EPIX DISP (ENDOMECHANICALS) ×3 IMPLANT
SEALANT SURGICAL APPL DUAL CAN (MISCELLANEOUS) ×3 IMPLANT
SET IRRIG TUBING LAPAROSCOPIC (IRRIGATION / IRRIGATOR) ×3 IMPLANT
SHEARS CURVED HARMONIC AC 45CM (MISCELLANEOUS) ×3 IMPLANT
SLEEVE GASTRECTOMY 36FR VISIGI (MISCELLANEOUS) ×3 IMPLANT
SLEEVE XCEL OPT CAN 5 100 (ENDOMECHANICALS) ×9 IMPLANT
SOLUTION ANTI FOG 6CC (MISCELLANEOUS) ×3 IMPLANT
STAPLE ECHEON FLEX 60 POW ENDO (STAPLE) ×3 IMPLANT
SUT MNCRL AB 4-0 PS2 18 (SUTURE) ×3 IMPLANT
SUT SURGIDAC NAB ES-9 0 48 120 (SUTURE) ×3 IMPLANT
SYR 20CC LL (SYRINGE) ×3 IMPLANT
SYR 50ML LL SCALE MARK (SYRINGE) ×3 IMPLANT
TOWEL OR NON WOVEN STRL DISP B (DISPOSABLE) ×3 IMPLANT
TRAY FOLEY METER SIL LF 16FR (CATHETERS) ×3 IMPLANT
TROCAR BLADELESS OPT 5 100 (ENDOMECHANICALS) ×3 IMPLANT
TROCAR UNIVERSAL OPT 12M 100M (ENDOMECHANICALS) ×3 IMPLANT
TROCAR XCEL 12X100 BLDLESS (ENDOMECHANICALS) IMPLANT
TUBING CONNECTING 10 (TUBING) ×4 IMPLANT
TUBING CONNECTING 10' (TUBING) ×2
TUBING ENDO SMARTCAP (MISCELLANEOUS) ×3 IMPLANT
TUBING FILTER THERMOFLATOR (ELECTROSURGICAL) ×3 IMPLANT

## 2013-09-05 NOTE — Transfer of Care (Signed)
Immediate Anesthesia Transfer of Care Note  Patient: Charlotte Moses  Procedure(s) Performed: Procedure(s): LAPAROSCOPIC GASTRIC SLEEVE RESECTION and REPAIR OF HIATAL HERNIA, upper endoscopy (N/A)  Patient Location: PACU  Anesthesia Type:General  Level of Consciousness: awake, alert , oriented and patient cooperative  Airway & Oxygen Therapy: Patient Spontanous Breathing and Patient connected to face mask oxygen  Post-op Assessment: Report given to PACU RN, Post -op Vital signs reviewed and stable and Patient moving all extremities X 4  Post vital signs: stable  Complications: No apparent anesthesia complications

## 2013-09-05 NOTE — Interval H&P Note (Signed)
History and Physical Interval Note:  09/05/2013 7:05 AM  Charlotte Moses  has presented today for surgery, with the diagnosis of morbid obesity   The various methods of treatment have been discussed with the patient and family. After consideration of risks, benefits and other options for treatment, the patient has consented to  Procedure(s): LAPAROSCOPIC GASTRIC SLEEVE RESECTION and REPAIR OF HIATAL HERNIA (N/A) as a surgical intervention .  The patient's history has been reviewed, patient examined, no change in status, stable for surgery.  I have reviewed the patient's chart and labs.  Questions were answered to the patient's satisfaction.    Leighton Ruff. Redmond Pulling, MD, [place], Bariatric, & Minimally Invasive Surgery [facility], [place]   Gayland Curry

## 2013-09-05 NOTE — H&P (View-Only) (Signed)
Patient ID: Charlotte Moses, female   DOB: 11-13-1955, 58 y.o.   MRN: [id number]  Chief Complaint  Patient presents with  . Bariatric Pre-op    HPI Charlotte Moses is a 58 y.o. female.   HPI 58 year old obese Caucasian female comes in today for her preoperative appointment. I initially saw her in November 2014. At that time her weight was 239 pounds. She was initially interested in laparoscopic adjustable gastric band surgery; however, she decided on going with a sleeve gastrectomy due to her work schedule. She is currently scheduled for surgery this coming Monday. She has started her preoperative diet. She denies any changes since I saw her in the fall. She denies any new medications or allergies. She did watch the EMMI video on sleeve gastrectomy. Past Medical History  Diagnosis Date  . Personal history of colonic polyps   . Diabetes mellitus without complication   . Hyperlipidemia   . Obesity   . Asthma     exercise induced  . Arthritis     knees    Past Surgical History  Procedure Laterality Date  . Knee surgery Left ~25 years ago  . Vein surgery Right more than 10 years ago    varicose veins  . Colonoscopy N/A 2008  . Polypectomy  2008    benign  . Hemorroidectomy  ~5 years ago  . Eye surgery Bilateral ~10 years ago    corrective    Family History  Problem Relation Age of Onset  . Hyperlipidemia Other   . Hypertension Other   . Obesity Other   . Ulcers Other     Social History History  Substance Use Topics  . Smoking status: Former Smoker -- 20 years    Types: Cigarettes    Quit date: 05/26/1988  . Smokeless tobacco: Never Used  . Alcohol Use: Yes     Comment: occasional    Allergies  Allergen Reactions  . Latex Itching  . Codeine Rash    Current Outpatient Prescriptions  Medication Sig Dispense Refill  . CINNAMON PO Take 1 tablet by mouth daily.      . Coenzyme Q10 (COQ10 PO) Take 1 tablet by mouth daily.      Marland Kitchen FREESTYLE TEST STRIPS test strip       .  metFORMIN (GLUCOPHAGE) 500 MG tablet       . Multiple Vitamin (MULTI VITAMIN DAILY PO) Take 1 tablet by mouth daily. occuvite      . Multiple Vitamin (MULTIVITAMIN) tablet Take 1 tablet by mouth daily.      . pravastatin (PRAVACHOL) 40 MG tablet Take 40 mg by mouth daily.       Marland Kitchen oxyCODONE (ROXICODONE) 5 MG/5ML solution Take 5-10 mLs (5-10 mg total) by mouth every 4 (four) hours as needed for severe pain.  200 mL  0   No current facility-administered medications for this visit.    Review of Systems Review of Systems  Constitutional: Negative for fever, activity change, appetite change and unexpected weight change.  HENT: Negative for nosebleeds and trouble swallowing.   Eyes: Negative for photophobia and visual disturbance.  Respiratory: Negative for chest tightness and shortness of breath.   Cardiovascular: Negative for chest pain and leg swelling.       Denies CP, SOB, orthopnea, PND, DOE  Genitourinary: Negative for dysuria and difficulty urinating.  Musculoskeletal: Negative for arthralgias.       Osteoarthritis of both knees  Skin: Negative for pallor and rash.  Neurological: Negative for dizziness,  seizures, facial asymmetry and numbness.       Denies TIA and amaurosis fugax   Hematological: Negative for adenopathy. Does not bruise/bleed easily.  Psychiatric/Behavioral: Negative for behavioral problems and agitation.    Blood pressure 130/74, pulse 77, temperature 97.5 F (36.4 C), temperature source Oral, resp. rate 16, height 5\' 4"  (1.626 m), weight 230 lb 12.8 oz (104.69 kg).  Physical Exam Physical Exam  Vitals reviewed. Constitutional: She is oriented to person, place, and time. She appears well-developed and well-nourished. No distress.  Apple shape  HENT:  Head: Normocephalic and atraumatic.  Right Ear: External ear normal.  Left Ear: External ear normal.  Eyes: Conjunctivae are normal. No scleral icterus.  Neck: Normal range of motion. Neck supple. No tracheal  deviation present. No thyromegaly present.  Cardiovascular: Normal rate, normal heart sounds and intact distal pulses.   Pulmonary/Chest: Effort normal and breath sounds normal. No respiratory distress. She has no wheezes.  Abdominal: Soft. She exhibits no distension. There is no tenderness. There is no rebound and no guarding.  Musculoskeletal: Normal range of motion. She exhibits no edema and no tenderness.  Lymphadenopathy:    She has no cervical adenopathy.  Neurological: She is alert and oriented to person, place, and time. She exhibits normal muscle tone.  Skin: Skin is warm and dry. No rash noted. She is not diaphoretic. No erythema. No pallor.  Psychiatric: She has a normal mood and affect. Her behavior is normal. Judgment and thought content normal.    Data Reviewed My office note November 2014 Initial evaluation labs: Hemoglobin A1c 7.4; triglyceride level 195, HDL 39 Abdominal ultrasound-as the liver Upper GI tiny hiatal hernia CT chest-lung nodules, recommend CT followup Sleep evaluation-unremarkable  Assessment    Obesity BMI 39.62 Osteoarthritis Dyslipidemia Diabetes mellitus type 2 Lung nodules Fatty liver Possible small hiatal hernia     Plan    We reviewed her workup to date including her radiological imaging. We discussed that she will need a followup CT scan sometime in the late summer or early fall. She has started her preoperative diet. We discussed the importance of that to help shrink the liver. She was given her prescription for pain medication today.  We discussed laparoscopic sleeve gastrectomy. We discussed the preoperative, operative and postoperative process. Using diagrams, I explained the surgery in detail including the performance of an EGD near the end of the surgery and an Upper GI swallow study on POD 1. We discussed the typical hospital course including a 2-3 day stay baring any complications.   The patient was given educational material. I  quoted the patient that most patients can lose up to 50-70% of their excess weight. We did discuss the possibility of weight regain several years after the procedure.  The risks of infection, bleeding, pain, scarring, weight regain, too little or too much weight loss, vitamin deficiencies and need for lifelong vitamin supplementation, hair loss, need for protein supplementation, leaks, stricture, reflux, food intolerance, gallstone formation, hernia, need for reoperation and conversion to roux Y gastric bypass, need for open surgery, injury to spleen or surrounding structures, DVT's, PE, and death again discussed with the patient and the patient expressed understanding and desires to proceed with laparoscopic vertical sleeve gastrectomy, possible open, intraoperative endoscopy.   Leighton Ruff. Redmond Pulling, MD, FACS General, Bariatric, & Minimally Invasive Surgery [facility], [place]'       Gayland Curry 09/02/2013, 6:08 PM

## 2013-09-05 NOTE — Anesthesia Postprocedure Evaluation (Signed)
  Anesthesia Post-op Note  Patient: Charlotte Moses  Procedure(s) Performed: Procedure(s) (LRB): LAPAROSCOPIC GASTRIC SLEEVE RESECTION and REPAIR OF HIATAL HERNIA, upper endoscopy (N/A)  Patient Location: PACU  Anesthesia Type: General  Level of Consciousness: awake and alert   Airway and Oxygen Therapy: Patient Spontanous Breathing  Post-op Pain: mild  Post-op Assessment: Post-op Vital signs reviewed, Patient's Cardiovascular Status Stable, Respiratory Function Stable, Patent Airway and No signs of Nausea or vomiting  Last Vitals:  Filed Vitals:   09/05/13 1130  BP: 131/64  Pulse: 50  Temp:   Resp: 15    Post-op Vital Signs: stable   Complications: No apparent anesthesia complications. To floor on OSA precautions.

## 2013-09-05 NOTE — Op Note (Signed)
09/05/2013 Charlotte Moses April 08, 1956 [id number]   PRE-OPERATIVE DIAGNOSIS:   Obesity BMI 39.62  Osteoarthritis  Dyslipidemia  Diabetes mellitus type 2  Lung nodules  Fatty liver  Possible small hiatal hernia   POST-OPERATIVE DIAGNOSIS:   Obesity BMI 39.62  Osteoarthritis  Dyslipidemia  Diabetes mellitus type 2  Lung nodules  Fatty liver  hiatal hernia   PROCEDURE:  Procedure(s): LAPAROSCOPIC SLEEVE GASTRECTOMY  HIATAL HERNIA REPAIR UPPER GI ENDOSCOPY  SURGEON:  Surgeon(s): Gayland Curry, MD FACS  ASSISTANTS: Alphonsa Overall, MD FACS  ANESTHESIA:   general  DRAINS: none   BOUGIE: 69 fr ViSiGi  LOCAL MEDICATIONS USED:  MARCAINE     SPECIMEN:  Source of Specimen:  Greater curvature of stomach  DISPOSITION OF SPECIMEN:  PATHOLOGY  COUNTS:  YES  INDICATION FOR PROCEDURE: This is a very pleasant 58 year old morbidly obese WF who has had unsuccessful attempts for sustained weight loss. She presents today for a planned laparoscopic sleeve gastrectomy with upper endoscopy and possible hiatal hernia repair. We have discussed the risk and benefits of the procedure extensively preoperatively. Please see my separate notes.  PROCEDURE: After obtaining informed consent and receiving 5000 units of subcutaneous heparin, the patient was brought to the operating room at [facility] and placed supine on the operating room table. General endotracheal anesthesia was established. Sequential compression devices were placed. A Foley catheter was placed. The patient's abdomen was prepped and draped in the usual standard surgical fashion. She received preoperative IV antibiotics. A surgical timeout was performed.  Access to the abdomen was achieved using a 5 mm 0 laparoscope thru a 5 mm trocar In the left upper Quadrant 2 fingerbreadths below the left subcostal margin using the Optiview technique. Pneumoperitoneum was smoothly established up to 15 mm of mercury. The laparoscope was advanced  and the abdominal cavity was surveilled. There were no unusual findings on laparoscopy.  A 5 mm trocar was placed slightly above and to the left of the umbilicus under direct visualization. The patient was then placed in reverse Trendelenburg. The [facility] liver retractor was placed under the left lobe of the liver through a 5 mm trocar incision site in the subxiphoid position. The patient had a very large floppy left hepatic lobe. A 5 mm trocar was placed in the lateral right upper quadrant along with a 12 mm trocar in the mid right abdomen  All under direct visualization after local had been infiltrated.  The stomach was inspected. It was completely decompressed. Because of the possibility of a hiatal hernia, the nurse anesthetist is placed a oral gastric calibration tube into the stomach. It was initially inflated with 15 cc of air and gently pulled back toward the GE junction. It adequate resistance at the GE junction. The tube was decompressed then readvanced into the stomach. At this point 10 cc of air was inflated into the balloon And the calibration tubing was pulled back toward the GE junction. It slid through the hiatus. Because of this I decided that we would need to fix her hiatal hernia at the end of the case. The calibration tube was decompressed and removed from the patient.  We identified the pylorus and measured 5 cm proximal to the pylorus and identified an area of where we would start taking down the short gastric vessels. Harmonic scalpel was used to take down the short gastric vessels along the greater curvature of the stomach. We were able to enter the lesser sac. We continued to march along the greater  curvature of the stomach taking down the short gastrics. As we approached the gastrosplenic ligament we took care in this area not to injure the spleen. We were able to take down the entire gastrosplenic ligament. We then mobilized the fundus away from the left crus of diaphragm. There were  not any significant posterior gastric avascular attachments. This left the stomach completely mobilized. No vessels had been taken down along the lesser curvature of the stomach.  We then reidentified the pylorus.  A 36Fr ViSiGi was placed. The ViSiGi was then advanced and placed in the distal antrum and positioned along the lesser curvature. It was placed under suction which secured the 36Fr ViSiGi in place along the lesser curve. Then using the Ethicon echelon 60 mm stapler with a gold load, I placed a stapler along the antrum approximately 5 cm from the pylorus. The stapler was angled so that there is ample room at the angularis incisura. I then fired the first staple load after inspecting it posteriorly to ensure adequate space both anteriorly and posteriorly. At this point I still was not completely past the angularis so with another gold load, I placed the stapler in position just inside the prior stapleline. We then rotated the stomach to insure that there was adequate anteriorly as well as posteriorly. The stapler was then fired. At this point I started using blue load staple cartridges. The echelon stapler was then repositioned with a 60 mm blue load and we continued to march up along the [place]. My assistant was holding traction along the greater curvature stomach along the cauterized short gastric vessels ensuring that the stomach was symmetrically retracted. Prior to each firing of the staple, we rotated the stomach to ensure that there is adequate stomach left. The left upper quadrant 5 mm trocar was upsized to a 12 mm trocar to accommodate laparoscopic stapling. As we approached the fundus, the last firing of the stapler was lateral to the esophageal fat pad. A small tear in the splenic capsule was made while trying to advance the stapler upward. There was a little bleeding. Although the staples on this fire had completely gone thru the last part of the stomach it had not completely cut it. Therefore  1 additional 60 blue load was used to free the remaining stomach. The sleeve was inspected. There is no evidence of cork screw. The staple line appeared hemostatic. A piece of surgicel and 4x4 gauze was placed over the 0.5cm tear in the splenic capsule. At this point the [place] was taken off suction. We then proceeded with repairing the hiatal hernia. The gastrohepatic ligament was incised with harmonic scalpel. The right crus of the diaphragm was identified. I identified the left crus as well as the posterior vagus nerve. There is a space between the left and right crus. I then reapproximated the left and right crus with an interrupted 0 Ethibond Endo Stitch and securing it with a titanium tie knot. I then decided to test the sleeve.The CRNA inflated the ViSiGi to the green zone and the upper abdomen was flooded with saline. There were no bubbles. The sleeve was decompressed and the ViSiGi removed. My assistant scrubbed out and performed an upper endoscopy. The sleeve easily distended with air and the scope was easily advanced to the pylorus. There was a little bit of an enlarged fundus. There is no evidence of internal bleeding or cork screwing. There is no evidence of bubbles. Please see his operative note for further details. The gastric sleeve  was decompressed and the endoscope was removed. Tisseel tissue sealant was applied along the entire length of the staple line. The greater curvature the stomach was grasped with a laparoscopic grasper and removed from the 15 mm trocar site.  The liver retractor was removed. I then closed the 15 mm trocar site with 1 interrupted 0 Vicryl sutures through the fascia using the endoclose. The closure was viewed laparoscopically and it was airtight. Pneumoperitoneum was released. All trocar sites were closed with a 4-0 Monocryl in a subcuticular fashion followed by the application of Dermabond. The Foley catheter was removed. The patient was extubated and taken to the recovery  room in stable condition. All needle, instrument, and sponge counts were correct x2. There are no immediate complications  PLAN OF CARE: Admit to inpatient   PATIENT DISPOSITION:  PACU - hemodynamically stable.   Delay start of Pharmacological VTE agent (>24hrs) due to surgical blood loss or risk of bleeding:  no  Leighton Ruff. Redmond Pulling, MD, FACS General, Bariatric, & Minimally Invasive Surgery [facility], [place]

## 2013-09-05 NOTE — Op Note (Signed)
Name:  [name] MRN: [id number] Date of Surgery: 09/05/2013  Preop Diagnosis:  Morbid Obesity  Postop Diagnosis:  Morbid Obesity (Weight - 230, BMI - 39.6), S/P Gastric Sleeve  Procedure:  Upper endoscopy  (Intraoperative)  Surgeon:  Alphonsa Overall, M.D.  Anesthesia:  GET  Indications for procedure: Charlotte Moses is a 58 y.o. female whose primary care physician is Golden Pop, MD and has completed a Gastric Sleeve today by Dr. Redmond Pulling.  I am doing an intraoperative upper endoscopy to evaluate the gastric pouch.  Operative Note: The patient is under general anesthesia.  Dr. Redmond Pulling is laparoscoping the patient while I do an upper endoscopy to evaluate the stomach pouch.  With the patient intubated, I passed the Pentax upper endoscope without difficulty down the esophagus.  The esophago-gastric junction was at 39 cm.    The mucosa of the stomach looked viable and the staple line was intact without bleeding.  I advanced to the pylorus, but did not go through it.  While I insufflated the stomach pouch with air, Dr. Redmond Pulling  flooded the upper abdomen with saline to put the gastric pouch under saline.  There was no bubbling or evidence of a leak.   There was no evidence of narrowing of the pouch and the gastric sleeve looked tubular.  The scope was then withdrawn.  The esophagus was unremarkable and the patient tolerated the endoscopy without difficulty.  Alphonsa Overall, MD, [facility] Surgery Pager: [phone] Office phone:  [phone]

## 2013-09-06 ENCOUNTER — Inpatient Hospital Stay (HOSPITAL_COMMUNITY): Payer: BC Managed Care – PPO

## 2013-09-06 ENCOUNTER — Encounter (HOSPITAL_COMMUNITY): Payer: Self-pay | Admitting: General Surgery

## 2013-09-06 LAB — COMPREHENSIVE METABOLIC PANEL
ALBUMIN: 3.4 g/dL — AB (ref 3.5–5.2)
ALT: 140 U/L — ABNORMAL HIGH (ref 0–35)
AST: 99 U/L — ABNORMAL HIGH (ref 0–37)
Alkaline Phosphatase: 65 U/L (ref 39–117)
BUN: 11 mg/dL (ref 6–23)
CALCIUM: 8.8 mg/dL (ref 8.4–10.5)
CO2: 26 mEq/L (ref 19–32)
Chloride: 102 mEq/L (ref 96–112)
Creatinine, Ser: 0.6 mg/dL (ref 0.50–1.10)
GFR calc non Af Amer: >90 mL/min (ref >90)
GLUCOSE: 103 mg/dL — AB (ref 70–99)
Potassium: 4.3 mEq/L (ref 3.7–5.3)
SODIUM: 139 meq/L (ref 137–147)
Total Bilirubin: 0.4 mg/dL (ref 0.3–1.2)
Total Protein: 6.2 g/dL (ref 6.0–8.3)

## 2013-09-06 LAB — CBC WITH DIFFERENTIAL/PLATELET
Basophils Absolute: 0 10*3/uL (ref 0.0–0.1)
Basophils Relative: 0 % (ref 0–1)
Eosinophils Absolute: 0 10*3/uL (ref 0.0–0.7)
Eosinophils Relative: 0 % (ref 0–5)
HCT: 37 % (ref 36.0–46.0)
Hemoglobin: 11.9 g/dL — ABNORMAL LOW (ref 12.0–15.0)
LYMPHS ABS: 2.2 10*3/uL (ref 0.7–4.0)
LYMPHS PCT: 15 % (ref 12–46)
MCH: 27.5 pg (ref 26.0–34.0)
MCHC: 32.2 g/dL (ref 30.0–36.0)
MCV: 85.6 fL (ref 78.0–100.0)
Monocytes Absolute: 1.3 10*3/uL — ABNORMAL HIGH (ref 0.1–1.0)
Monocytes Relative: 9 % (ref 3–12)
NEUTROS ABS: 10.9 10*3/uL — AB (ref 1.7–7.7)
NEUTROS PCT: 76 % (ref 43–77)
PLATELETS: 252 10*3/uL (ref 150–400)
RBC: 4.32 MIL/uL (ref 3.87–5.11)
RDW: 13.9 % (ref 11.5–15.5)
WBC: 14.4 10*3/uL — AB (ref 4.0–10.5)

## 2013-09-06 LAB — GLUCOSE, CAPILLARY
GLUCOSE-CAPILLARY: 95 mg/dL (ref 70–99)
GLUCOSE-CAPILLARY: 99 mg/dL (ref 70–99)
Glucose-Capillary: 98 mg/dL (ref 70–99)
Glucose-Capillary: 96 mg/dL (ref 70–99)
Glucose-Capillary: 93 mg/dL (ref 70–99)
Glucose-Capillary: 88 mg/dL (ref 70–99)

## 2013-09-06 LAB — HEMOGLOBIN AND HEMATOCRIT, BLOOD
HCT: 37 % (ref 36.0–46.0)
Hemoglobin: 11.8 g/dL — ABNORMAL LOW (ref 12.0–15.0)

## 2013-09-06 MED ORDER — IOHEXOL 300 MG/ML  SOLN
50.0000 mL | Freq: Once | INTRAMUSCULAR | Status: AC | PRN
  Administered 2013-09-06: 30 mL via ORAL

## 2013-09-06 MED ORDER — SIMETHICONE 40 MG/0.6ML PO SUSP
40.0000 mg | Freq: Four times a day (QID) | ORAL | Status: DC | PRN
  Administered 2013-09-06: 40 mg via ORAL
  Filled 2013-09-06: qty 0.6

## 2013-09-06 NOTE — Progress Notes (Signed)
1 Day Post-Op  Subjective: Co gas discomfort in upper abd. No heartburn/nausea. Walked multiple times  Objective: Vital signs in last 24 hours: Temp:  [97.4 F (36.3 C)-99.3 F (37.4 C)] 97.9 F (36.6 C) (04/14 0530) Pulse Rate:  [43-89] 52 (04/14 0530) Resp:  [11-20] 18 (04/14 0530) BP: (113-160)/(64-99) 146/81 mmHg (04/14 0530) SpO2:  [95 %-100 %] 99 % (04/14 0530) Last BM Date: 09/05/13  Intake/Output from previous day: 04/13 0701 - 04/14 0700 In: 4266.7 [I.V.:4166.7; IV Piggyback:100] Out: 0131 [Urine:1625; Blood:50] Intake/Output this shift:    Alert, nad cta Reg Obese, mild distension. Incision c/d/i No edema   Lab Results:   Recent Labs  09/05/13 1355 09/06/13 0458  WBC  --  14.4*  HGB 12.7 11.9*  HCT 38.9 37.0  PLT  --  252   BMET  Recent Labs  09/06/13 0458  NA 139  K 4.3  CL 102  CO2 26  GLUCOSE 103*  BUN 11  CREATININE 0.60  CALCIUM 8.8   PT/INR No results found for this basename: LABPROT, INR,  in the last 72 hours ABG No results found for this basename: PHART, PCO2, PO2, HCO3,  in the last 72 hours  Studies/Results: No results found.  Anti-infectives: Anti-infectives   Start     Dose/Rate Route Frequency Ordered Stop   09/05/13 0554  cefOXitin (MEFOXIN) 2 g in dextrose 5 % 50 mL IVPB  Status:  Discontinued     2 g 100 mL/hr over 30 Minutes Intravenous On call to O.R. 09/05/13 0554 09/05/13 1236      Assessment/Plan: s/p Procedure(s): LAPAROSCOPIC GASTRIC SLEEVE RESECTION and REPAIR OF HIATAL HERNIA, upper endoscopy (N/A)  No fever, tachycardia - looks good For UGI today. If normal will start POD 1 diet Cont VTE prophylaxis - scds, lovenox pulm toilet  Leighton Ruff. Redmond Pulling, MD, FACS General, Bariatric, & Minimally Invasive Surgery [facility], [place]   LOS: 1 day    Gayland Curry 09/06/2013

## 2013-09-06 NOTE — Care Management Note (Signed)
    Page 1 of 1   09/06/2013     2:18:39 PM   CARE MANAGEMENT NOTE 09/06/2013  Patient:  Charlotte Moses, Charlotte Moses   Account Number:  [id number]  Date Initiated:  09/06/2013  Documentation initiated by:  Sunday Spillers  Subjective/Objective Assessment:   58 yo female admitted s/p sleeve gastrectomy. PTA lived at home alone.     Action/Plan:   Home when stable   Anticipated DC Date:  09/08/2013   Anticipated DC Plan:  [place]  CM consult      Choice offered to / List presented to:             Status of service:  Completed, signed off Medicare Important Message given?   (If response is "NO", the following Medicare IM given date fields will be blank) Date Medicare IM given:   Date Additional Medicare IM given:    Discharge Disposition:  HOME/SELF CARE  Per UR Regulation:  Reviewed for med. necessity/level of care/duration of stay  If discussed at [place] of Stay Meetings, dates discussed:    Comments:

## 2013-09-06 NOTE — Progress Notes (Addendum)
Nutrition Education Note  Patient identified via consult for DROP protocol.   Educated pt and parents on 2 week post op diet. Emphasis that liquids be not carbonated, not caffeinated, and sugar free. Protein shakes and bariatric multivitamins and minerals reviewed. Handouts of diet plan with vitamin schedule provided per pt request. Expect good compliance. Pt has follow up appointment scheduled with outpatient RD for further diet advancement.    Body mass index is 39.1 kg/(m^2). Patient meets criteria for class II obesity based on current BMI.   Diet: First 2 Weeks  You will see the nutritionist about two (2) weeks after your surgery. The nutritionist will increase the types of foods you can eat if you are handling liquids well:  If you have severe vomiting or nausea and cannot handle clear liquids lasting longer than 1 day, call your surgeon  Protein Shake  Drink at least 2 ounces of shake 5-6 times per day  Each serving of protein shakes (usually 8 - 12 ounces) should have a minimum of:  15 grams of protein  And no more than 5 grams of carbohydrate  Goal for protein each day:  Men = 80 grams per day  Women = 60 grams per day  Protein powder may be added to fluids such as non-fat milk or Lactaid milk or Soy milk (limit to 35 grams added protein powder per serving)   Hydration  Slowly increase the amount of water and other clear liquids as tolerated (See Acceptable Fluids)  Slowly increase the amount of protein shake as tolerated  Sip fluids slowly and throughout the day  May use sugar substitutes in small amounts (no more than 6 - 8 packets per day; i.e. Splenda)   Fluid Goal  The first goal is to drink at least 8 ounces of protein shake/drink per day (or as directed by the nutritionist); some examples of protein shakes are [employer], [employer]R [employer], EAS Edge HP, and Unjury. See handout from pre-op Bariatric Education Class:  Slowly increase the amount of protein shake you drink  as tolerated  You may find it easier to slowly sip shakes throughout the day  It is important to get your proteins in first  Your fluid goal is to drink 64 - 100 ounces of fluid daily  It may take a few weeks to build up to this  32 oz (or more) should be clear liquids  And  32 oz (or more) should be full liquids (see below for examples)  Liquids should not contain sugar, caffeine, or carbonation   Clear Liquids:  Water or Sugar-free flavored water (i.e. Fruit H2O, Propel)  Decaffeinated coffee or tea (sugar-free)  Crystal Lite, Wyler's Lite, Minute Maid Lite  Sugar-free Jell-O  Bouillon or broth  Sugar-free Popsicle: *Less than 20 calories each; Limit 1 per day   Full Liquids:  Protein Shakes/Drinks + 2 choices per day of other full liquids  Full liquids must be:  No More Than 12 grams of Carbs per serving  No More Than 3 grams of Fat per serving  Strained low-fat cream soup  Non-Fat milk  Fat-free Lactaid Milk  Sugar-free yogurt (Dannon Lite & Fit, Seven Devils yogurt)   Firth MS, [place], [place] Pager [phone] After Hours Pager

## 2013-09-06 NOTE — Progress Notes (Signed)
Patient alert and oriented, Post op day 1.  Provided support and encouragement.  Encouraged pulmonary toilet, ambulation and small sips of liquids.  All questions answered.  Will continue to monitor. 

## 2013-09-07 ENCOUNTER — Telehealth (INDEPENDENT_AMBULATORY_CARE_PROVIDER_SITE_OTHER): Payer: Self-pay

## 2013-09-07 DIAGNOSIS — K449 Diaphragmatic hernia without obstruction or gangrene: Secondary | ICD-10-CM

## 2013-09-07 LAB — CBC WITH DIFFERENTIAL/PLATELET
BASOS ABS: 0.1 10*3/uL (ref 0.0–0.1)
Basophils Relative: 1 % (ref 0–1)
Eosinophils Absolute: 0.2 10*3/uL (ref 0.0–0.7)
Eosinophils Relative: 2 % (ref 0–5)
HEMATOCRIT: 38.2 % (ref 36.0–46.0)
HEMOGLOBIN: 12.1 g/dL (ref 12.0–15.0)
Lymphocytes Relative: 25 % (ref 12–46)
Lymphs Abs: 2.6 10*3/uL (ref 0.7–4.0)
MCH: 27.1 pg (ref 26.0–34.0)
MCHC: 31.7 g/dL (ref 30.0–36.0)
MCV: 85.5 fL (ref 78.0–100.0)
Monocytes Absolute: 1 10*3/uL (ref 0.1–1.0)
Monocytes Relative: 10 % (ref 3–12)
Neutro Abs: 6.7 10*3/uL (ref 1.7–7.7)
Neutrophils Relative %: 63 % (ref 43–77)
Platelets: 251 10*3/uL (ref 150–400)
RBC: 4.47 MIL/uL (ref 3.87–5.11)
RDW: 14 % (ref 11.5–15.5)
WBC: 10.5 10*3/uL (ref 4.0–10.5)

## 2013-09-07 LAB — GLUCOSE, CAPILLARY
GLUCOSE-CAPILLARY: 112 mg/dL — AB (ref 70–99)
GLUCOSE-CAPILLARY: 89 mg/dL (ref 70–99)
Glucose-Capillary: 81 mg/dL (ref 70–99)

## 2013-09-07 MED ORDER — PANTOPRAZOLE SODIUM 40 MG PO TBEC: 40.0000 mg | DELAYED_RELEASE_TABLET | Freq: Every day | ORAL | Status: DC

## 2013-09-07 NOTE — Progress Notes (Signed)
Patient alert and oriented, pain is controlled. Patient is tolerating fluids, plan to advance to protein shake today. Reviewed Gastric Bypass discharge instructions with patient and patient is able to articulate understanding. Provided information on BELT program, Support Group and [facility] outpatient pharmacy. All questions answered, will continue to monitor.  

## 2013-09-07 NOTE — Progress Notes (Signed)
2 Days Post-Op  Subjective: No co. No n/v. Some mild gas pain. +flatus. Tolerated shake last night and this am  Objective: Vital signs in last 24 hours: Temp:  [97.6 F (36.4 C)-98.1 F (36.7 C)] 98.1 F (36.7 C) (04/15 0507) Pulse Rate:  [64-86] 68 (04/15 0507) Resp:  [16-18] 18 (04/15 0507) BP: (133-146)/(71-85) 143/85 mmHg (04/15 0507) SpO2:  [93 %-100 %] 97 % (04/15 0507) Last BM Date: 09/05/13  Intake/Output from previous day: 04/14 0701 - 04/15 0700 In: 3043.3 [P.O.:60; I.V.:2933.3; IV Piggyback:50] Out: 3100 [Urine:3100] Intake/Output this shift: Total I/O In: -  Out: 450 [Urine:450]  Alert, sitting in chair cta b/l Reg Soft, obese, expected mild incisional tenderness. Incisions c/d/i No edema  Lab Results:   Recent Labs  09/06/13 0458 09/06/13 1555 09/07/13 0450  WBC 14.4*  --  10.5  HGB 11.9* 11.8* 12.1  HCT 37.0 37.0 38.2  PLT 252  --  251   BMET  Recent Labs  09/06/13 0458  NA 139  K 4.3  CL 102  CO2 26  GLUCOSE 103*  BUN 11  CREATININE 0.60  CALCIUM 8.8   PT/INR No results found for this basename: LABPROT, INR,  in the last 72 hours ABG No results found for this basename: PHART, PCO2, PO2, HCO3,  in the last 72 hours  Studies/Results: Dg Ugi W/water Sol Cm  09/06/2013   CLINICAL DATA:  Postop gastric sleeve procedure.  EXAM: WATER SOLUBLE UPPER GI SERIES  TECHNIQUE: Single-column upper GI series was performed using water soluble contrast.  CONTRAST:  70mL OMNIPAQUE IOHEXOL 300 MG/ML  SOLN  COMPARISON:  Preoperative study 05/03/2013  FLUOROSCOPY TIME:  1 min and 28 seconds  FINDINGS: The esophagus is unremarkable. The GE junction is normal. No hiatal hernia or GE reflux. The gastric sleeve appears to be in good position with associated moderate narrowing of the body region of the stomach. Contrast does travel through the sleeve and into the antral region of the stomach.  IMPRESSION: Expected postoperative appearance of stomach without  complicating features.   Electronically Signed   By: Kalman Jewels M.D.   On: 09/06/2013 10:55    Anti-infectives: Anti-infectives   Start     Dose/Rate Route Frequency Ordered Stop   09/05/13 0554  cefOXitin (MEFOXIN) 2 g in dextrose 5 % 50 mL IVPB  Status:  Discontinued     2 g 100 mL/hr over 30 Minutes Intravenous On call to O.R. 09/05/13 0554 09/05/13 1236      Assessment/Plan: s/p Procedure(s): LAPAROSCOPIC GASTRIC SLEEVE RESECTION and REPAIR OF HIATAL HERNIA, upper endoscopy (N/A)  Looks good. No fever, tachycardia. No evid of leak Tolerating liquids hgb stable  If does well this am, will discharge late this am.  Discharge teaching  Leighton Ruff. Redmond Pulling, MD, FACS General, Bariatric, & Minimally Invasive Surgery [facility], [place]   LOS: 2 days    Gayland Curry 09/07/2013

## 2013-09-07 NOTE — Telephone Encounter (Signed)
Pt calling in to see if we would switch the [facility] [place] but they told pt she would have to get our office to switch the script. I changed the pt's pharmacy in the computer and reordered the Protonix. The pt understands.

## 2013-09-07 NOTE — Discharge Instructions (Signed)

## 2013-09-08 ENCOUNTER — Telehealth (HOSPITAL_COMMUNITY): Payer: Self-pay

## 2013-09-08 NOTE — Telephone Encounter (Signed)
Made discharge phone call to patient per DROP protocol. Asking the following questions.    1. Do you have someone to care for you now that you are home?  yes 2. Are you having pain now that is not relieved by your pain medication?  no 3. Are you able to drink the recommended daily amount of fluids (48 ounces minimum/day) and protein (60-80 grams/day) as prescribed by the dietitian or nutritional counselor?  yes 4. Are you taking the vitamins and minerals as prescribed?  yes 5. Do you have the "on call" number to contact your surgeon if you have a problem or question?  yes 6. Are your incisions free of redness, swelling or drainage? (If steri strips, address that these can fall off, shower as tolerated) yes 7. Have your bowels moved since your surgery?  If not, are you passing gas?  No, yes 8. Are you up and walking 3-4 times per day?  yes 9. Do you have an appointment to see a dietitian or nutritional counselor in the next month?  yes  The following questions can be added at the center's discharge phone call script at the center's discretion.  The questions are also captured within [employer]. project custom fields. 1. Do you have an appointment made to see your surgeon in the next month?  yes 2. Were you provided your discharge medications before your surgery or before you were discharged from the hospital and are you taking them without problem?  yes 3. Were you provided phone numbers to the clinic/surgeon's office?  yes 4. Did you watch the patient education video module in the (clinic, surgeon's office, etc.) before your surgery? yes 5. Do you have a discharge checklist that was provided to you in the hospital to reference with instructions on how to take care of yourself after surgery?  yes 6. Did you see a dietitian or nutritional counselor while you were in the hospital?  yes   

## 2013-09-09 DIAGNOSIS — E118 Type 2 diabetes mellitus with unspecified complications: Secondary | ICD-10-CM

## 2013-09-09 DIAGNOSIS — IMO0002 Reserved for concepts with insufficient information to code with codable children: Secondary | ICD-10-CM | POA: Diagnosis present

## 2013-09-09 DIAGNOSIS — E1165 Type 2 diabetes mellitus with hyperglycemia: Secondary | ICD-10-CM | POA: Diagnosis present

## 2013-09-09 NOTE — Discharge Summary (Signed)
Physician Discharge Summary  Charlotte Moses YKD:[id number] DOB: 12-24-1955 DOA: 09/05/2013  PCP: Golden Pop, MD  Admit date: 09/05/2013 Discharge date: 09/07/2013  Recommendations for Outpatient Follow-up:  1. See below   Discharge Diagnoses:  Active Problems:   Obesity, Class III, BMI 40-49.9 (morbid obesity)   S/P laparoscopic sleeve gastrectomy   Type II or unspecified type diabetes mellitus with unspecified complication, uncontrolled Obesity BMI 39.62  Osteoarthritis  Dyslipidemia  Lung nodules  Fatty liver  small hiatal hernia    Surgical Procedure: Laparoscopic sleeve gastrectomy, upper endoscopy  Discharge Condition: Good Disposition: Home  Diet recommendation: Postoperative sleeve gastrectomy diet  Filed Weights   09/05/13 0534  Weight: 227 lb 6 oz (103.137 kg)     Hospital Course:  The patient was admitted for a planned laparoscopic sleeve gastrectomy. Please see operative note. Preoperatively the patient was given 5000 units of subcutaneous heparin for DVT prophylaxis. Postoperative prophylactic Lovenox dosing was started on the morning of postoperative day 1. The patient underwent an upper GI on postoperative day 1 which demonstrated no extravasation of contrast and emptying of the contrast into the Roux limb. The patient was started on ice chips and water which they tolerated. On postoperative day 2 The patient's diet was advanced to protein shakes which they also tolerated. The patient was ambulating without difficulty. Their vital signs are stable without fever or tachycardia. Their hemoglobin had remained stable. The patient had received discharge instructions and counseling. They were deemed stable for discharge.   Discharge Instructions  Discharge Orders   Future Appointments Provider Department Dept Phone   09/15/2013 9:00 AM Gayland Curry, MD [facility], [place]   09/20/2013 3:30 PM [id number] Post-Op Class [facility] [phone]   Future Orders Complete By Expires   Discharge instructions  As directed    Increase activity slowly  As directed        Medication List    STOP taking these medications       CINNAMON PO     COQ10 PO      TAKE these medications       FREESTYLE TEST STRIPS test strip  Generic drug:  glucose blood     metFORMIN 500 MG tablet  Commonly known as:  GLUCOPHAGE     multivitamin tablet  Take 1 tablet by mouth daily.     MULTI VITAMIN DAILY PO  Take 1 tablet by mouth daily. occuvite     oxyCODONE 5 MG/5ML solution  Commonly known as:  ROXICODONE  Take 5-10 mLs (5-10 mg total) by mouth every 4 (four) hours as needed for severe pain.     pravastatin 40 MG tablet  Commonly known as:  PRAVACHOL  Take 40 mg by mouth daily.          The results of significant diagnostics from this hospitalization (including imaging, microbiology, ancillary and laboratory) are listed below for reference.    Significant Diagnostic Studies: Dg Ugi W/water Sol Cm  09/06/2013   CLINICAL DATA:  Postop gastric sleeve procedure.  EXAM: WATER SOLUBLE UPPER GI SERIES  TECHNIQUE: Single-column upper GI series was performed using water soluble contrast.  CONTRAST:  30mL OMNIPAQUE IOHEXOL 300 MG/ML  SOLN  COMPARISON:  Preoperative study 05/03/2013  FLUOROSCOPY TIME:  1 min and 28 seconds  FINDINGS: The esophagus is unremarkable. The GE junction is normal. No hiatal hernia or GE reflux. The gastric sleeve appears to be in good position with associated moderate narrowing of  the body region of the stomach. Contrast does travel through the sleeve and into the antral region of the stomach.  IMPRESSION: Expected postoperative appearance of stomach without complicating features.   Electronically Signed   By: Kalman Jewels M.D.   On: 09/06/2013 10:55    Labs: Basic Metabolic Panel:  Recent Labs Lab 09/06/13 0458  NA 139  K 4.3  CL 102  CO2 26  GLUCOSE 103*  BUN 11   CREATININE 0.60  CALCIUM 8.8   Liver Function Tests:  Recent Labs Lab 09/06/13 0458  AST 99*  ALT 140*  ALKPHOS 65  BILITOT 0.4  PROT 6.2  ALBUMIN 3.4*    CBC:  Recent Labs Lab 09/05/13 1355 09/06/13 0458 09/06/13 1555 09/07/13 0450  WBC  --  14.4*  --  10.5  NEUTROABS  --  10.9*  --  6.7  HGB 12.7 11.9* 11.8* 12.1  HCT 38.9 37.0 37.0 38.2  MCV  --  85.6  --  85.5  PLT  --  252  --  251    CBG:  Recent Labs Lab 09/06/13 1936 09/06/13 2322 09/07/13 0337 09/07/13 0740 09/07/13 1159  GLUCAP 95 99 81 112* 89    Active Problems:   Obesity, Class III, BMI 40-49.9 (morbid obesity)   S/P laparoscopic sleeve gastrectomy   Type II or unspecified type diabetes mellitus with unspecified complication, uncontrolled   Time coordinating discharge: 10 minutes  Signed:  Gayland Curry, MD [facility], [place] [phone] 09/09/2013, 7:44 AM

## 2013-09-15 ENCOUNTER — Encounter (INDEPENDENT_AMBULATORY_CARE_PROVIDER_SITE_OTHER): Payer: Self-pay | Admitting: General Surgery

## 2013-09-15 ENCOUNTER — Ambulatory Visit (INDEPENDENT_AMBULATORY_CARE_PROVIDER_SITE_OTHER): Payer: BC Managed Care – PPO | Admitting: General Surgery

## 2013-09-15 VITALS — BP 118/80 | HR 68 | Temp 97.1°F | Ht 64.0 in | Wt 220.0 lb

## 2013-09-15 DIAGNOSIS — Z9884 Bariatric surgery status: Secondary | ICD-10-CM

## 2013-09-15 NOTE — Patient Instructions (Signed)
Take your protonix daily for 1 month Walk daily  Eating techniques 20-20-20 (30-30-30) 20 chews, 20 seconds between bites of food, 20 minutes to eat; sometimes you may need 30 chews, 30 seconds etc Use your nondominant hand to eat with Put fork down between bites of food Use a timer after swallowing to reinforce waiting 20-30 sec between bites of food Use a child/infant size utensil Try not to eat while watching TV

## 2013-09-15 NOTE — Progress Notes (Signed)
Subjective:     Patient ID: Charlotte Moses, female   DOB: 1956/04/27, 58 y.o.   MRN: [id number]  HPI 58 year old Caucasian female comes in today for her first postoperative appointment. She underwent laparoscopic sleeve gastrectomy on April 13. She was discharged from hospital April 15. She states that she is doing well. She denies any fever, chills, nausea or vomiting. She states that she is tolerating liquids without a problem. She is taking her multivitamin and supplements as directed. Currently she she is not taking her Protonix as directed. She reports normal bowel movements and urination. She reports normal energy level. She completed a round of golf yesterday. She did have one episode of some fullness when she ate some Jell-O too rapidly. She states that she is hungry a short time after drinking her protein shakes and liquids  Review of Systems     Objective:   Physical Exam BP 118/80  Pulse 68  Temp(Src) 97.1 F (36.2 C)  Ht 5\' 4"  (1.626 m)  Wt 220 lb (99.791 kg)  BMI 37.74 kg/m2  Gen: alert, NAD, non-toxic appearing Pupils: equal, no scleral icterus Pulm: Lungs clear to auscultation, symmetric chest rise CV: regular rate and rhythm Abd: soft, nontender, nondistended. Well-healed trocar sites. No cellulitis. No incisional hernia Ext: no edema, Skin: no rash, no jaundice     Assessment:     S/p sleeve gastrectomy     Plan:     Overall I think she is doing well. Her initial visit weight was 239 pounds. Her preoperative weight was 230 pounds. I congratulated her on her weight loss. I have asked her to take her protonic for 30 days after surgery. She is to see the nutritionist next week 4 diet advancement. I instructed her that it is imperative should she stay on the liquids as for now. We did discuss the risk of leak over the next couple weeks and reeducated her about the signs and symptoms. I will see her back in 3 weeks' time. We discussed importance of trying to get up to 64  ounces of liquids a day.   Leighton Ruff. Redmond Pulling, MD, FACS General, Bariatric, & Minimally Invasive Surgery [facility], [place]

## 2013-09-20 ENCOUNTER — Encounter: Payer: BC Managed Care – PPO | Attending: General Surgery

## 2013-09-20 DIAGNOSIS — Z713 Dietary counseling and surveillance: Secondary | ICD-10-CM | POA: Insufficient documentation

## 2013-09-21 NOTE — Progress Notes (Signed)
Patient to follow Phase 3A-Soft, High Protein Diet and follow-up at [facility] in 6 weeks for 2 months post-op nutrition visit for diet advancement. 

## 2013-10-06 ENCOUNTER — Ambulatory Visit (INDEPENDENT_AMBULATORY_CARE_PROVIDER_SITE_OTHER): Payer: BC Managed Care – PPO | Admitting: General Surgery

## 2013-10-06 ENCOUNTER — Encounter (INDEPENDENT_AMBULATORY_CARE_PROVIDER_SITE_OTHER): Payer: Self-pay | Admitting: General Surgery

## 2013-10-06 VITALS — BP 122/74 | HR 66 | Temp 97.1°F | Resp 14 | Ht 64.0 in | Wt 211.0 lb

## 2013-10-06 DIAGNOSIS — Z9884 Bariatric surgery status: Secondary | ICD-10-CM

## 2013-10-06 DIAGNOSIS — E669 Obesity, unspecified: Secondary | ICD-10-CM

## 2013-10-06 NOTE — Patient Instructions (Signed)
You can resume full activities Continue to exercise daily Stay hydrated  Eating techniques 20-20-20 (30-30-30) 20 chews, 20 seconds between bites of food, 20 minutes to eat; sometimes you may need 30 chews, 30 seconds etc Use your nondominant hand to eat with Put fork down between bites of food Use a timer after swallowing to reinforce waiting 20-30 sec between bites of food Use a child/infant size utensil Try not to eat while watching TV

## 2013-10-06 NOTE — Progress Notes (Signed)
Subjective:     Patient ID: Charlotte Moses, female   DOB: 03-18-1956, 58 y.o.   MRN: [id number]  HPI 58 year old Caucasian female comes in for her one-month appointment after undergoing laparoscopic sleeve gastrectomy on April 13. I last saw her in the office on April 23. At that time her weight was 220 pounds. She states that she has been doing great since I saw her last. She has returned to work. She denies any nausea, vomiting, regurgitation, diarrhea. She has had a little bit of constipation. She reports that she is taking her supplements. Right now she is walking daily. She does have a Physiological scientist. She denies any fatigue. She denies any difficulty with certain foods. She does get full on a smaller amount of food. She is not able to eat large portions of food anymore. She states her fasting blood sugar level is around 112.  Review of Systems     Objective:   Physical Exam BP 122/74  Pulse 66  Temp(Src) 97.1 F (36.2 C)  Resp 14  Ht 5\' 4"  (1.626 m)  Wt 211 lb (95.709 kg)  BMI 36.20 kg/m2  Gen: alert, NAD, non-toxic appearing Pupils: equal, no scleral icterus Pulm: Lungs clear to auscultation, symmetric chest rise CV: regular rate and rhythm Abd: soft, nontender, nondistended. Well-healed trocar sites. No cellulitis. No incisional hernia Ext: no edema, no calf tenderness Skin: no rash, no jaundice     Assessment:     S/p lap sleeve gastrectomy DM     Plan:     Overall I think she is doing well. There appear to be no complications from surgery. Her initial visit weight was 239 pounds. Her preoperative weight was 230 pounds. I congratulated her on her weight loss.  We discussed proper eating techniques and behaviors. She was given information regarding this. I have released her to full activities. She can resume full cardiovascular activity with her trainer.  We discussed the importance of exercising daily in order to supplement her weight loss. We discussed the importance of  compliance of the supplements.  Followup 3 months  Leighton Ruff. Redmond Pulling, MD, FACS General, Bariatric, & Minimally Invasive Surgery [facility], [place]

## 2013-11-01 ENCOUNTER — Ambulatory Visit: Payer: BC Managed Care – PPO | Admitting: Dietician

## 2013-11-07 NOTE — Progress Notes (Signed)
Chart audit for approved QI project.  M.K.Trudee Grip, BSN, RN, [place], [place], RRT

## 2013-12-12 ENCOUNTER — Encounter: Payer: BC Managed Care – PPO | Attending: General Surgery | Admitting: Dietician

## 2013-12-12 VITALS — Ht 64.0 in | Wt 199.0 lb

## 2013-12-12 DIAGNOSIS — Z713 Dietary counseling and surveillance: Secondary | ICD-10-CM | POA: Diagnosis not present

## 2013-12-12 DIAGNOSIS — E669 Obesity, unspecified: Secondary | ICD-10-CM | POA: Diagnosis present

## 2013-12-12 DIAGNOSIS — Z9884 Bariatric surgery status: Secondary | ICD-10-CM | POA: Diagnosis not present

## 2013-12-12 DIAGNOSIS — Z6834 Body mass index (BMI) 34.0-34.9, adult: Secondary | ICD-10-CM | POA: Insufficient documentation

## 2013-12-12 NOTE — Patient Instructions (Signed)
Goals:  Follow Phase 3B: High Protein + Non-Starchy Vegetables  Eat 3-6 small meals/snacks, every 3-5 hrs  Increase lean protein foods to meet 60g goal  Increase fluid intake to 64oz +  Avoid drinking 15 minutes before, during and 30 minutes after eating  Aim for >30 min of physical activity daily  

## 2013-12-12 NOTE — Progress Notes (Signed)
  Follow-up visit:  12 Weeks Post-Operative Sleeve Gastrectomy Surgery  Medical Nutrition Therapy:  Appt start time: 1430 end time:  1500.  Primary concerns today: Post-operative Bariatric Surgery Nutrition Management. Alaylah returns with a 20.5 lb weight loss. Having trouble tolerating some spicy foods. Has not had dumping syndrome. Hgb A1c is 5.3%. No longer taking any medication. Testing blood sugar every other day and averaging 108 mg/dl.   Surgery date: 09/05/2013  Surgery type: Gastric sleeve  Start weight at [facility]: 238 on 06/06/2013  Weight today:199.0  Weight loss: 20.5 lbs Total weight loss: 39 lbs   TANITA BODY COMP RESULTS   08/22/13  09/20/13 12/12/13  BMI (kg/m^2)  41.1  37.7 34.2  Fat Mass (lbs)  121.5  108 92.5  Fat Free Mass (lbs)  118  111.5 106.5  Total Body Water (lbs)  86.5  81.5 78.0    Preferred Learning Style:   No preference indicated   Learning Readiness:   Ready  24-hr recall: B (AM): Protein shake with flax seeds or Premier Protein or eggs/omelette with cheese and bacon (20 g) Snk (AM): decaf coffee and hot tea  L (PM): 2 oz tuna, chicken salad, or leftovers (roast beef, fish, cottage cheese) (14 g) Snk (PM): sugar free popsicle or drink with protein D (PM): 2 oz  tuna, chicken salad, or leftovers (roast beef, fish, cottage cheese) with vegetable (14 g) Snk (PM): SF popsicle or edamame   Fluid intake: decaf coffee, protein shake, water or flavored water (almost 64 oz) Estimated total protein intake: 55-60 g  Medications: off medications Supplementation: taking  CBG monitoring: every other day  Average CBG per patient: 108 mg/dl Last patient reported A1c: 5.3% per patient  Using straws: No Drinking while eating: No Hair loss: starting to loss hair, not sure if it's due to the surgery Carbonated beverages: No N/V/D/C: occassionally will have diarrhea after eating tomatoes Dumping syndrome: No  Recent physical activity:  Walking 5 x week or  biking, hiking, and has a trainer 2x week   Progress Towards Goal(s):  In progress.  Handouts given during visit include:  Phase 3B High Protein + Non Starchy Vegetables   Nutritional Diagnosis:  [place]-3.3 Overweight/obesity related to past poor dietary habits and physical inactivity as evidenced by patient w/ recent Sleeve Gastrectomy surgery following dietary guidelines for continued weight loss.    Intervention:  Nutrition education/Diet advancement.  Teaching Method Utilized:  Visual Auditory Hands on  Barriers to learning/adherence to lifestyle change: none  Demonstrated degree of understanding via:  Teach Back   Monitoring/Evaluation:  Dietary intake, exercise, lap band fills, and body weight. Follow up in 3 months for 6 month post-op visit.

## 2014-01-13 ENCOUNTER — Ambulatory Visit (INDEPENDENT_AMBULATORY_CARE_PROVIDER_SITE_OTHER): Payer: BC Managed Care – PPO | Admitting: General Surgery

## 2014-01-13 ENCOUNTER — Encounter (INDEPENDENT_AMBULATORY_CARE_PROVIDER_SITE_OTHER): Payer: Self-pay | Admitting: General Surgery

## 2014-01-13 ENCOUNTER — Telehealth (INDEPENDENT_AMBULATORY_CARE_PROVIDER_SITE_OTHER): Payer: Self-pay

## 2014-01-13 VITALS — BP 114/70 | HR 66 | Temp 97.9°F | Resp 16 | Ht 64.0 in | Wt 197.0 lb

## 2014-01-13 DIAGNOSIS — Z9884 Bariatric surgery status: Secondary | ICD-10-CM

## 2014-01-13 DIAGNOSIS — E669 Obesity, unspecified: Secondary | ICD-10-CM

## 2014-01-13 NOTE — Patient Instructions (Signed)
Keep up good work! Start increasing daily step count each week

## 2014-01-13 NOTE — Telephone Encounter (Signed)
Medical record request for lab results faxed to [facility] @ [phone].  Requested to be faxed back to Dr. Redmond Pulling & Bernie's attention.  Fax confirmation rec'd and attached to outgoing fax.

## 2014-01-14 NOTE — Progress Notes (Addendum)
Subjective:     Patient ID: Charlotte Moses, female   DOB: 30-Jun-1955, 58 y.o.   MRN: [id number]  HPI 58 year old Caucasian female comes in for her 21-month appointment after undergoing laparoscopic sleeve gastrectomy on April 13. I last saw her in the office on May 14. At that time her weight was 211 pounds. She states that she has been doing great since I saw her last. She has returned to work. She denies any nausea, vomiting, regurgitation, diarrhea. She has had a little bit of constipation. She reports that she is taking her supplements. Right now she is walking daily. She does have a Physiological scientist. She denies any fatigue. She denies any difficulty with certain foods. She does get full on a smaller amount of food. She is not able to eat large portions of food anymore.   PMHx, PSHx, SOCHx, FAMHx, ALL reviewed and unchanged  Review of Systems A 10 point Review of systems was performed and all systems are negative except for what is mentioned in the history of present illness     Objective:   Physical Exam BP 114/70  Pulse 66  Temp(Src) 97.9 F (36.6 C) (Temporal)  Resp 16  Ht 5\' 4"  (1.626 m)  Wt 197 lb (89.359 kg)  BMI 33.80 kg/m2  Gen: alert, NAD, non-toxic appearing Pupils: equal, no scleral icterus Pulm: Lungs clear to auscultation, symmetric chest rise CV: regular rate and rhythm Abd: soft, nontender, nondistended. Well-healed trocar sites. No cellulitis. No incisional hernia Ext: no edema, no calf tenderness Skin: no rash, no jaundice     Assessment:     S/p lap sleeve gastrectomy DM     Plan:     Overall I think she is doing well. There appear to be no complications from surgery. Her initial visit weight was 239 pounds. Her preoperative weight was 230 pounds. I congratulated her on her weight loss.  We discussed proper eating techniques and behaviors. She was given information regarding this.we discussed monitoring her daily step count and setting weekly goals to increase  her daily step count.   We discussed the importance of exercising daily in order to supplement her weight loss. We discussed the importance of compliance of the supplements.  She states she had labs recently checked at her PCP. We will request a copy and order what is missing  Followup 3 months  Leighton Ruff. Redmond Pulling, MD, FACS General, Bariatric, & Minimally Invasive Surgery [facility], [place]

## 2014-01-23 ENCOUNTER — Other Ambulatory Visit (INDEPENDENT_AMBULATORY_CARE_PROVIDER_SITE_OTHER): Payer: Self-pay | Admitting: General Surgery

## 2014-01-23 DIAGNOSIS — Z903 Acquired absence of stomach [part of]: Secondary | ICD-10-CM

## 2014-01-23 NOTE — Telephone Encounter (Signed)
Pt's PCP did not draw CBC, Vit D 25, Folate, and Vit B12.  Pt instructed to go by [facility] in the next few days for these labs to be drawn.  Orders in [place].  Dr. Redmond Pulling made aware.

## 2014-01-26 LAB — CBC
HEMATOCRIT: 40 % (ref 36.0–46.0)
Hemoglobin: 13.5 g/dL (ref 12.0–15.0)
MCH: 26.6 pg (ref 26.0–34.0)
MCHC: 33.8 g/dL (ref 30.0–36.0)
MCV: 78.9 fL (ref 78.0–100.0)
PLATELETS: 326 10*3/uL (ref 150–400)
RBC: 5.07 MIL/uL (ref 3.87–5.11)
RDW: 15.3 % (ref 11.5–15.5)
WBC: 12.1 10*3/uL — AB (ref 4.0–10.5)

## 2014-01-27 LAB — VITAMIN D 25 HYDROXY (VIT D DEFICIENCY, FRACTURES): Vit D, 25-Hydroxy: 36 ng/mL (ref 30–89)

## 2014-01-27 LAB — FOLATE: Folate: 20 ng/mL

## 2014-01-27 LAB — VITAMIN B12: VITAMIN B 12: 582 pg/mL (ref 211–911)

## 2014-01-31 NOTE — Telephone Encounter (Signed)
Pt made aware additional lab work normal.

## 2014-03-14 ENCOUNTER — Ambulatory Visit: Payer: BC Managed Care – PPO | Admitting: Dietician

## 2014-04-24 ENCOUNTER — Ambulatory Visit: Payer: BC Managed Care – PPO | Admitting: Dietician

## 2014-05-29 ENCOUNTER — Encounter: Payer: BC Managed Care – PPO | Attending: General Surgery | Admitting: Dietician

## 2014-05-29 VITALS — Ht 64.0 in | Wt 192.0 lb

## 2014-05-29 DIAGNOSIS — Z6832 Body mass index (BMI) 32.0-32.9, adult: Secondary | ICD-10-CM | POA: Diagnosis not present

## 2014-05-29 DIAGNOSIS — Z713 Dietary counseling and surveillance: Secondary | ICD-10-CM | POA: Insufficient documentation

## 2014-05-29 DIAGNOSIS — E669 Obesity, unspecified: Secondary | ICD-10-CM

## 2014-05-29 NOTE — Progress Notes (Signed)
  Follow-up visit:  6 Months Post-Operative Sleeve Gastrectomy Surgery  Medical Nutrition Therapy:  Appt start time: 1500 end time:  3785.  Primary concerns today: Post-operative Bariatric Surgery Nutrition Management. Charlotte Moses returns with a 7 lb weight loss. Had gained weight over the holidays since she was tasting things as she was cooking.  Has been traveling with work and exercising while on the road.  Surgery date: 09/05/2013  Surgery type: Gastric sleeve  Start weight at [facility]: 238 on 06/06/2013  Weight today:192.0 lbs  Weight loss: 7 lbs Total weight loss: 46 lbs   TANITA BODY COMP RESULTS   08/22/13  09/20/13 12/12/13 05/29/14  BMI (kg/m^2)  41.1  37.7 34.2 33.0  Fat Mass (lbs)  121.5  108 92.5 86.0  Fat Free Mass (lbs)  118  111.5 106.5 106.0  Total Body Water (lbs)  86.5  81.5 78.0 77.5    Preferred Learning Style:   No preference indicated   Learning Readiness:   Ready  24-hr recall: B (AM): Protein shake with flax seeds or Premier Protein or eggs/omelette with cheese and bacon (20 g) Snk (AM): decaf coffee and hot tea  L (PM): 2 oz tuna, chicken salad, or leftovers (roast beef, fish, cottage cheese) (14 g) Snk (PM): 1-2 tablespoons of peanut butter or nuts (6 g) D (PM): 3 steak or chicken, or leftovers (roast beef, fish, cottage cheese) with vegetable (21 g) Snk (PM): 1-2 tablespoons of peanut butter or nuts or edamame (6 g) or sugar free popsicles (3-4 per day)  Fluid intake: coffee and decaf coffee, protein shake, water or flavored water (almost 64 oz) Estimated total protein intake: 60-67 g  Medications: off medications Supplementation: taking  CBG monitoring: 2 x week Average CBG per patient: 105 mg/dl Last patient reported A1c: 5.3% per patient  Using straws: No Drinking while eating: accidentally on occaision Hair loss: no longer losing as much  Carbonated beverages: No N/V/D/C: occassionally will have diarrhea after eating something she shouldn't (like  pecan pie) Dumping syndrome: maybe after pecan pie  Recent physical activity:  Walking/jogging 5 x week or biking, hiking, and has a trainer 2x week   Progress Towards Goal(s):  In progress.    Nutritional Diagnosis:  -3.3 Overweight/obesity related to past poor dietary habits and physical inactivity as evidenced by patient w/ recent Sleeve Gastrectomy surgery following dietary guidelines for continued weight loss.    Intervention:  Nutrition education/Diet reinforcement.  Teaching Method Utilized:  Visual Auditory Hands on  Barriers to learning/adherence to lifestyle change: none  Demonstrated degree of understanding via:  Teach Back   Monitoring/Evaluation:  Dietary intake, exercise, and body weight. Follow up in 6 months for 12 month post-op visit.

## 2014-05-29 NOTE — Patient Instructions (Addendum)
Goals:  Follow Phase 3B: High Protein + Non-Starchy Vegetables  Eat 3-6 small meals/snacks, every 3-5 hrs  Increase lean protein foods to meet 60g goal  Increase fluid intake to 64oz +  Avoid drinking 15 minutes before, during and 30 minutes after eating  Continue exercising regularly  Watch portions of nuts and peanut butter

## 2014-11-28 ENCOUNTER — Ambulatory Visit: Payer: BC Managed Care – PPO | Admitting: Dietician

## 2014-12-12 ENCOUNTER — Encounter: Payer: Self-pay | Admitting: Dietician

## 2014-12-12 ENCOUNTER — Encounter: Payer: BC Managed Care – PPO | Attending: General Surgery | Admitting: Dietician

## 2014-12-12 VITALS — Ht 64.0 in | Wt 185.0 lb

## 2014-12-12 DIAGNOSIS — Z9884 Bariatric surgery status: Secondary | ICD-10-CM | POA: Diagnosis not present

## 2014-12-12 DIAGNOSIS — Z48815 Encounter for surgical aftercare following surgery on the digestive system: Secondary | ICD-10-CM | POA: Diagnosis present

## 2014-12-12 DIAGNOSIS — Z713 Dietary counseling and surveillance: Secondary | ICD-10-CM | POA: Diagnosis not present

## 2014-12-12 DIAGNOSIS — Z6831 Body mass index (BMI) 31.0-31.9, adult: Secondary | ICD-10-CM | POA: Diagnosis not present

## 2014-12-12 DIAGNOSIS — E669 Obesity, unspecified: Secondary | ICD-10-CM

## 2014-12-12 NOTE — Patient Instructions (Addendum)
Goals:  Follow Phase 3B: High Protein + Non-Starchy Vegetables  Eat 3-6 small meals/snacks, every 3-5 hrs  Increase lean protein foods to meet 60g goal  Increase fluid intake to 64oz +  Avoid drinking 15 minutes before, during and 30 minutes after eating  Keep up the good work!  Surgery date: 09/05/2013  Surgery type: Gastric sleeve  Start weight at [facility]: 238 on 06/06/2013  Weight today:185.0 lbs  Weight loss: 7 lbs Total weight loss: 53 lbs Weight loss goal: 150-160 lbs   TANITA BODY COMP RESULTS   08/22/13  09/20/13 12/12/13 05/29/14 12/12/14  BMI (kg/m^2)  41.1  37.7 34.2 33.0 31.8  Fat Mass (lbs)  121.5  108 92.5 86.0 80.0  Fat Free Mass (lbs)  118  111.5 106.5 106.0 105.0  Total Body Water (lbs)  86.5  81.5 78.0 77.5 77.0

## 2014-12-12 NOTE — Progress Notes (Signed)
  Follow-up visit:  15 Months Post-Operative Sleeve Gastrectomy Surgery  Medical Nutrition Therapy:  Appt start time: 1400 end time:  1594.  Primary concerns today: Post-operative Bariatric Surgery Nutrition Management. Charlotte Moses returns with a 7 lb weight loss. Weight had been at a plateau and started dropping again recently. Noticing that she has to work harder to get her heart rate up. Tries to take stairs and works out when on the road. Takes protein shakes when traveling.   Feeling overall pretty happy with where she is at.   Surgery date: 09/05/2013  Surgery type: Gastric sleeve  Start weight at [facility]: 238 on 06/06/2013  Weight today:185.0 lbs  Weight loss: 7 lbs Total weight loss: 53 lbs Weight loss goal: 150-160 lbs   TANITA BODY COMP RESULTS   08/22/13  09/20/13 12/12/13 05/29/14 12/12/14  BMI (kg/m^2)  41.1  37.7 34.2 33.0 31.8  Fat Mass (lbs)  121.5  108 92.5 86.0 80.0  Fat Free Mass (lbs)  118  111.5 106.5 106.0 105.0  Total Body Water (lbs)  86.5  81.5 78.0 77.5 77.0    Preferred Learning Style:   No preference indicated   Learning Readiness:   Ready  24-hr recall: B (AM): Premier Protein or eggs/omelette with cheese and bacon (20 -30g) Snk (AM): decaf coffee and hot tea  L (PM): Protein shake or 3 peanut crackers or edamame or pork chops  (10-30 g) Snk (PM): none or peanut butter cracker D (PM): 3 oz steak or chicken, or leftovers (roast beef, fish, cottage cheese) with vegetable (21 g) Snk (PM): none  Fluid intake: 2 protein shakes 22 oz, 3 cups of  coffee and decaf coffee, 4 glasses flavored water (at least 64 oz) Estimated total protein intake: 60-81 g  Medications: off medications Supplementation: taking  CBG monitoring: 2 x week Average CBG per patient: 105 mg/dl Last patient reported A1c: 5.3% per patient  Using straws: No Drinking while eating: accidentally on occaision Hair loss: No Carbonated beverages: No N/V/D/C: constipated every once in a  while Dumping syndrome: none except if she is "bad"  Recent physical activity:  Elliptical/biking/hiking  5 x week and has a trainer 2x week   Progress Towards Goal(s):  In progress.    Nutritional Diagnosis:  [place]-3.3 Overweight/obesity related to past poor dietary habits and physical inactivity as evidenced by patient w/ recent Sleeve Gastrectomy surgery following dietary guidelines for continued weight loss.    Intervention:  Nutrition education/Diet reinforcement. Goals:  Follow Phase 3B: High Protein + Non-Starchy Vegetables  Eat 3-6 small meals/snacks, every 3-5 hrs  Increase lean protein foods to meet 60g goal  Increase fluid intake to 64oz +  Avoid drinking 15 minutes before, during and 30 minutes after eating  Keep up the good work!  Teaching Method Utilized:  Visual Auditory Hands on  Barriers to learning/adherence to lifestyle change: none  Demonstrated degree of understanding via:  Teach Back   Monitoring/Evaluation:  Dietary intake, exercise, and body weight. Follow up in 12 months for 27 month post-op visit.

## 2015-04-05 ENCOUNTER — Ambulatory Visit: Payer: BC Managed Care – PPO | Admitting: General Surgery

## 2015-04-10 ENCOUNTER — Encounter: Payer: Self-pay | Admitting: General Surgery

## 2015-04-11 ENCOUNTER — Ambulatory Visit (INDEPENDENT_AMBULATORY_CARE_PROVIDER_SITE_OTHER): Payer: BC Managed Care – PPO | Admitting: General Surgery

## 2015-04-11 ENCOUNTER — Encounter: Payer: Self-pay | Admitting: General Surgery

## 2015-04-11 VITALS — BP 124/74 | HR 76 | Resp 12 | Ht 64.0 in | Wt 178.0 lb

## 2015-04-11 DIAGNOSIS — Z1211 Encounter for screening for malignant neoplasm of colon: Secondary | ICD-10-CM

## 2015-04-11 MED ORDER — POLYETHYLENE GLYCOL 3350 17 GM/SCOOP PO POWD: ORAL | Status: DC

## 2015-04-11 NOTE — Patient Instructions (Addendum)
Colonoscopy A colonoscopy is an exam to look at the entire large intestine (colon). This exam can help find problems such as tumors, polyps, inflammation, and areas of bleeding. The exam takes about 1 hour.  LET [facility] CARE PROVIDER KNOW ABOUT:   Any allergies you have.  All medicines you are taking, including vitamins, herbs, eye drops, creams, and over-the-counter medicines.  Previous problems you or members of your family have had with the use of anesthetics.  Any blood disorders you have.  Previous surgeries you have had.  Medical conditions you have. RISKS AND COMPLICATIONS  Generally, this is a safe procedure. However, as with any procedure, complications can occur. Possible complications include:  Bleeding.  Tearing or rupture of the colon wall.  Reaction to medicines given during the exam.  Infection (rare). BEFORE THE PROCEDURE   Ask your health care provider about changing or stopping your regular medicines.  You may be prescribed an oral bowel prep. This involves drinking a large amount of medicated liquid, starting the day before your procedure. The liquid will cause you to have multiple loose stools until your stool is almost clear or light green. This cleans out your colon in preparation for the procedure.  Do not eat or drink anything else once you have started the bowel prep, unless your health care provider tells you it is safe to do so.  Arrange for someone to drive you home after the procedure. PROCEDURE   You will be given medicine to help you relax (sedative).  You will lie on your side with your knees bent.  A long, flexible tube with a light and camera on the end (colonoscope) will be inserted through the rectum and into the colon. The camera sends video back to a computer screen as it moves through the colon. The colonoscope also releases carbon dioxide gas to inflate the colon. This helps your health care provider see the area better.  During  the exam, your health care provider may take a small tissue sample (biopsy) to be examined under a microscope if any abnormalities are found.  The exam is finished when the entire colon has been viewed. AFTER THE PROCEDURE   Do not drive for 24 hours after the exam.  You may have a small amount of blood in your stool.  You may pass moderate amounts of gas and have mild abdominal cramping or bloating. This is caused by the gas used to inflate your colon during the exam.  Ask when your test results will be ready and how you will get your results. Make sure you get your test results.   This information is not intended to replace advice given to you by your health care provider. Make sure you discuss any questions you have with your health care provider.   Document Released: 05/09/2000 Document Revised: 03/02/2013 Document Reviewed: 01/17/2013 Elsevier Interactive Patient Education [employer].  Patient has been scheduled for a colonoscopy on 05-30-15 at [facility].

## 2015-04-11 NOTE — Progress Notes (Signed)
Patient ID: Charlotte Moses, female   DOB: [birth year], 59 y.o.   MRN: [id number]  Chief Complaint  Patient presents with  . Colonoscopy    HPI Charlotte Moses is a 59 y.o. female here today for a evaluation of a colonoscopy. Last one was on 02/01/2010. Patient states sometime she see bright red blood on the paper and in the bowl.   The patient reports her weight is down 62 pounds since her sleeve gastrectomy. HPI  Past Medical History  Diagnosis Date  . Personal history of colonic polyps   . Diabetes mellitus without complication ([place])   . Hyperlipidemia   . Obesity   . Asthma     exercise induced  . Arthritis     knees    Past Surgical History  Procedure Laterality Date  . Knee surgery Left ~25 years ago  . Vein surgery Right more than 10 years ago    varicose veins  . Colonoscopy N/A 2008, 2011  . Polypectomy  2008    benign  . Hemorroidectomy  07/30/2006    External hemorrhoid removed.  . Eye surgery Bilateral ~10 years ago    corrective  . Laparoscopic gastric sleeve resection N/A 09/05/2013    Procedure: LAPAROSCOPIC GASTRIC SLEEVE RESECTION and REPAIR OF HIATAL HERNIA, upper endoscopy;  Surgeon: Gayland Curry, MD;  Location: [facility];  Service: General;  Laterality: N/A;    Family History  Problem Relation Age of Onset  . Hyperlipidemia Other   . Hypertension Other   . Obesity Other   . Ulcers Other     Social History Social History  Substance Use Topics  . Smoking status: Former Smoker -- 20 years    Types: Cigarettes    Quit date: 05/26/1988  . Smokeless tobacco: Never Used  . Alcohol Use: Yes     Comment: occasional    Allergies  Allergen Reactions  . Latex Itching  . Codeine Rash    Current Outpatient Prescriptions  Medication Sig Dispense Refill  . Multiple Vitamin (MULTI VITAMIN DAILY PO) Take 1 tablet by mouth daily. occuvite    . polyethylene glycol powder (GLYCOLAX/MIRALAX) powder 255 grams one bottle for colonoscopy prep 255 g 0   No current  facility-administered medications for this visit.    Review of Systems Review of Systems  Constitutional: Negative.   Respiratory: Negative.   Cardiovascular: Negative.     Blood pressure 124/74, pulse 76, resp. rate 12, height 5\' 4"  (1.626 m), weight 178 lb (80.74 kg).  Patient's weight at the time of a 12/31/2009 exam: 242 pounds.  Physical Exam Physical Exam  Constitutional: She is oriented to person, place, and time. She appears well-developed and well-nourished.  Eyes: Conjunctivae are normal. No scleral icterus.  Neck: Neck supple.  Cardiovascular: Normal rate, regular rhythm and normal heart sounds.   Pulmonary/Chest: Effort normal and breath sounds normal.  Lymphadenopathy:    She has no cervical adenopathy.  Neurological: She is alert and oriented to person, place, and time.  Skin: Skin is warm and dry.    Data Reviewed January 2008 colonoscopy showed an adenomatous polyp was removed from the transverse colon and a hyperplastic polyp removed from the sigmoid colon. This study was completed by Lucilla Lame, M.D.  2011 colonoscopy showed an inflammatory polyp in the rectum.  Assessment    Candidate for follow-up colonoscopy.    Plan         Colonoscopy with possible biopsy/polypectomy prn: Information regarding the procedure, including its  potential risks and complications (including but not limited to perforation of the bowel, which may require emergency surgery to repair, and bleeding) was verbally given to the patient. Educational information regarding lower intestinal endoscopy was given to the patient. Written instructions for how to complete the bowel prep using Miralax were provided. The importance of drinking ample fluids to avoid dehydration as a result of the prep emphasized.  Patient has been scheduled for a colonoscopy on 05-30-15 at [facility].  PCP:  Kennieth Francois 04/13/2015, 2:11 PM

## 2015-04-13 ENCOUNTER — Encounter: Payer: Self-pay | Admitting: General Surgery

## 2015-04-13 DIAGNOSIS — Z1211 Encounter for screening for malignant neoplasm of colon: Secondary | ICD-10-CM | POA: Insufficient documentation

## 2015-05-23 ENCOUNTER — Telehealth: Payer: Self-pay | Admitting: *Deleted

## 2015-05-23 NOTE — Telephone Encounter (Signed)
Patient was contacted today and confirms no medication changes since last office visit. She reports she has Miralax prescription.   We will proceed with colonoscopy as scheduled for 05-30-15 at [facility].  Patient instructed to call the office should she have further questions.

## 2015-05-29 ENCOUNTER — Encounter: Payer: Self-pay | Admitting: *Deleted

## 2015-05-30 ENCOUNTER — Encounter
Admission: RE | Disposition: A | Discharge status: Home / Self Care | Payer: Self-pay | Source: Ambulatory Visit | Attending: General Surgery

## 2015-05-30 ENCOUNTER — Encounter: Payer: Self-pay | Admitting: Anesthesiology

## 2015-05-30 ENCOUNTER — Ambulatory Visit
Admission: RE | Admit: 2015-05-30 | Discharge: 2015-05-30 | Disposition: A | Discharge status: Home / Self Care | Payer: BC Managed Care – PPO | Source: Ambulatory Visit | Attending: General Surgery | Admitting: General Surgery

## 2015-05-30 ENCOUNTER — Ambulatory Visit: Payer: BC Managed Care – PPO | Admitting: Anesthesiology

## 2015-05-30 DIAGNOSIS — E785 Hyperlipidemia, unspecified: Secondary | ICD-10-CM | POA: Insufficient documentation

## 2015-05-30 DIAGNOSIS — J45909 Unspecified asthma, uncomplicated: Secondary | ICD-10-CM | POA: Diagnosis not present

## 2015-05-30 DIAGNOSIS — Z885 Allergy status to narcotic agent status: Secondary | ICD-10-CM | POA: Insufficient documentation

## 2015-05-30 DIAGNOSIS — D123 Benign neoplasm of transverse colon: Secondary | ICD-10-CM | POA: Insufficient documentation

## 2015-05-30 DIAGNOSIS — E669 Obesity, unspecified: Secondary | ICD-10-CM | POA: Insufficient documentation

## 2015-05-30 DIAGNOSIS — Z87891 Personal history of nicotine dependence: Secondary | ICD-10-CM | POA: Diagnosis not present

## 2015-05-30 DIAGNOSIS — Z1211 Encounter for screening for malignant neoplasm of colon: Secondary | ICD-10-CM | POA: Diagnosis not present

## 2015-05-30 DIAGNOSIS — Z9889 Other specified postprocedural states: Secondary | ICD-10-CM | POA: Insufficient documentation

## 2015-05-30 DIAGNOSIS — Z86010 Personal history of colonic polyps: Secondary | ICD-10-CM | POA: Diagnosis not present

## 2015-05-30 DIAGNOSIS — M170 Bilateral primary osteoarthritis of knee: Secondary | ICD-10-CM | POA: Insufficient documentation

## 2015-05-30 DIAGNOSIS — Z9884 Bariatric surgery status: Secondary | ICD-10-CM | POA: Insufficient documentation

## 2015-05-30 DIAGNOSIS — E119 Type 2 diabetes mellitus without complications: Secondary | ICD-10-CM | POA: Diagnosis not present

## 2015-05-30 DIAGNOSIS — Z91040 Latex allergy status: Secondary | ICD-10-CM | POA: Insufficient documentation

## 2015-05-30 HISTORY — PX: COLONOSCOPY WITH PROPOFOL: SHX5780

## 2015-05-30 SURGERY — COLONOSCOPY WITH PROPOFOL
Anesthesia: General

## 2015-05-30 MED ORDER — PROPOFOL 10 MG/ML IV BOLUS
INTRAVENOUS | Status: DC | PRN
Start: 2015-05-30 — End: 2015-05-30
  Administered 2015-05-30: 50 mg via INTRAVENOUS

## 2015-05-30 MED ORDER — GLYCOPYRROLATE 0.2 MG/ML IJ SOLN
INTRAMUSCULAR | Status: DC | PRN
  Administered 2015-05-30: 0.2 mg via INTRAVENOUS

## 2015-05-30 MED ORDER — LIDOCAINE HCL (CARDIAC) 20 MG/ML IV SOLN
INTRAVENOUS | Status: DC | PRN
  Administered 2015-05-30: 60 mg via INTRAVENOUS

## 2015-05-30 MED ORDER — SODIUM CHLORIDE 0.9 % IV SOLN
INTRAVENOUS | Status: DC
  Administered 2015-05-30: 1000 mL via INTRAVENOUS

## 2015-05-30 MED ORDER — PROPOFOL 500 MG/50ML IV EMUL
INTRAVENOUS | Status: DC | PRN
  Administered 2015-05-30: 150 ug/kg/min via INTRAVENOUS

## 2015-05-30 NOTE — Transfer of Care (Signed)
Immediate Anesthesia Transfer of Care Note  Patient: Charlotte Moses  Procedure(s) Performed: Procedure(s): COLONOSCOPY WITH PROPOFOL (N/A)  Patient Location: Endoscopy Unit  Anesthesia Type:General  Level of Consciousness: awake, alert , oriented and patient cooperative  Airway & Oxygen Therapy: Patient Spontanous Breathing and Patient connected to nasal cannula oxygen  Post-op Assessment: Report given to RN, Post -op Vital signs reviewed and stable and Patient moving all extremities X 4  Post vital signs: Reviewed and stable  Last Vitals:  Filed Vitals:   05/30/15 0907  BP: 144/83  Pulse: 75  Temp: 36.9 C  Resp: 16    Complications: No apparent anesthesia complications

## 2015-05-30 NOTE — H&P (Signed)
VESSIE SCHOENTHALER [id number] Oct 13, 1955     HPI: Candidate for screening colonoscopy.   Prescriptions prior to admission  Medication Sig Dispense Refill Last Dose  . Multiple Vitamin (MULTI VITAMIN DAILY PO) Take 1 tablet by mouth daily. occuvite   Taking  . polyethylene glycol powder (GLYCOLAX/MIRALAX) powder 255 grams one bottle for colonoscopy prep 255 g 0    Allergies  Allergen Reactions  . Latex Itching  . Codeine Rash   Past Medical History  Diagnosis Date  . Personal history of colonic polyps   . Diabetes mellitus without complication ([place])   . Hyperlipidemia   . Obesity   . Asthma     exercise induced  . Arthritis     knees   Past Surgical History  Procedure Laterality Date  . Knee surgery Left ~25 years ago  . Vein surgery Right more than 10 years ago    varicose veins  . Colonoscopy N/A 2008, 2011  . Polypectomy  2008    benign  . Hemorroidectomy  07/30/2006    External hemorrhoid removed.  . Eye surgery Bilateral ~10 years ago    corrective  . Laparoscopic gastric sleeve resection N/A 09/05/2013    Procedure: LAPAROSCOPIC GASTRIC SLEEVE RESECTION and REPAIR OF HIATAL HERNIA, upper endoscopy;  Surgeon: Gayland Curry, MD;  Location: [facility];  Service: General;  Laterality: N/A;   Social History   Social History  . Marital Status: Single    Spouse Name: N/A  . Number of Children: N/A  . Years of Education: N/A   Occupational History  . Not on file.   Social History Main Topics  . Smoking status: Former Smoker -- 20 years    Types: Cigarettes    Quit date: 05/26/1988  . Smokeless tobacco: Never Used  . Alcohol Use: Yes     Comment: occasional  . Drug Use: No  . Sexual Activity: Not on file   Other Topics Concern  . Not on file   Social History Narrative   Social History   Social History Narrative     ROS: Negative.     PE: HEENT: Negative. Lungs: Clear. Cardio: RR. Robert Bellow 05/30/2015   Assessment/Plan:  Proceed with  planned endoscopy.

## 2015-05-30 NOTE — Anesthesia Preprocedure Evaluation (Signed)
Anesthesia Evaluation  Patient identified by MRN, date of birth, ID band Patient awake    Reviewed: Allergy & Precautions, H&P , NPO status , Patient's Chart, lab work & pertinent test results  History of Anesthesia Complications Negative for: history of anesthetic complications  Airway Mallampati: III  TM Distance: >3 FB Neck ROM: limited    Dental  (+) Poor Dentition   Pulmonary neg shortness of breath, asthma , former smoker,    Pulmonary exam normal breath sounds clear to auscultation       Cardiovascular Exercise Tolerance: Good (-) angina(-) Past MI and (-) DOE negative cardio ROS Normal cardiovascular exam Rhythm:regular Rate:Normal     Neuro/Psych negative neurological ROS  negative psych ROS   GI/Hepatic negative GI ROS, Neg liver ROS,   Endo/Other  diabetes, Type 2  Renal/GU negative Renal ROS  negative genitourinary   Musculoskeletal  (+) Arthritis ,   Abdominal   Peds  Hematology negative hematology ROS (+)   Anesthesia Other Findings Past Medical History:   Personal history of colonic polyps                           Diabetes mellitus without complication ([place])                 Hyperlipidemia                                               Obesity                                                      Asthma                                                         Comment:exercise induced   Arthritis                                                      Comment:knees  Past Surgical History:   KNEE SURGERY                                    Left ~25 years*   VEIN SURGERY                                    Right more than*     Comment:varicose veins   COLONOSCOPY                                     N/A 2008, 2011   POLYPECTOMY  2008           Comment:benign   HEMORROIDECTOMY                                  07/30/2006     Comment:External hemorrhoid removed.   EYE  SURGERY                                     Bilateral ~10 years*     Comment:corrective   LAPAROSCOPIC GASTRIC SLEEVE RESECTION           N/A 09/05/2013      Comment:Procedure: LAPAROSCOPIC GASTRIC SLEEVE               RESECTION and REPAIR OF HIATAL HERNIA, upper               endoscopy;  Surgeon: Gayland Curry, MD;                Location: [facility];  Service: General;                Laterality: N/A;     Reproductive/Obstetrics negative OB ROS                             Anesthesia Physical Anesthesia Plan  ASA: III  Anesthesia Plan: General   Post-op Pain Management:    Induction:   Airway Management Planned:   Additional Equipment:   Intra-op Plan:   Post-operative Plan:   Informed Consent: I have reviewed the patients History and Physical, chart, labs and discussed the procedure including the risks, benefits and alternatives for the proposed anesthesia with the patient or authorized representative who has indicated his/her understanding and acceptance.   Dental Advisory Given  Plan Discussed with: Anesthesiologist, CRNA and Surgeon  Anesthesia Plan Comments:         Anesthesia Quick Evaluation

## 2015-05-30 NOTE — Anesthesia Postprocedure Evaluation (Signed)
Anesthesia Post Note  Patient: Charlotte Moses  Procedure(s) Performed: Procedure(s) (LRB): COLONOSCOPY WITH PROPOFOL (N/A)  Patient location during evaluation: Endoscopy Anesthesia Type: General Level of consciousness: awake and alert Pain management: pain level controlled Vital Signs Assessment: post-procedure vital signs reviewed and stable Respiratory status: spontaneous breathing, nonlabored ventilation, respiratory function stable and patient connected to nasal cannula oxygen Cardiovascular status: blood pressure returned to baseline and stable Postop Assessment: no signs of nausea or vomiting Anesthetic complications: no    Last Vitals:  Filed Vitals:   05/30/15 1031 05/30/15 1041  BP: 108/77 122/73  Pulse: 62 54  Temp:    Resp: 13 15    Last Pain: There were no vitals filed for this visit.               Precious Haws Corey Caulfield

## 2015-05-30 NOTE — Op Note (Signed)
[facility] Patient Name: Charlotte Moses Procedure Date: 05/30/2015 9:34 AM MRN: [id number] Account #: [id number] Date of Birth: July 02, 1955 Admit Type: Outpatient Age: 60 Room: [facility] ENDO ROOM 1 Gender: Female Note Status: Finalized Procedure:         Colonoscopy Indications:       Screening for colorectal malignant neoplasm Providers:         Robert Bellow, MD Referring MD:      Guadalupe Maple, MD (Referring MD) Medicines:         Monitored Anesthesia Care Complications:     No immediate complications. Procedure:         Pre-Anesthesia Assessment:                    - Prior to the procedure, a History and Physical was                     performed, and patient medications, allergies and                     sensitivities were reviewed. The patient's tolerance of                     previous anesthesia was reviewed.                    - The risks and benefits of the procedure and the sedation                     options and risks were discussed with the patient. All                     questions were answered and informed consent was obtained.                    After obtaining informed consent, the colonoscope was                     passed under direct vision. Throughout the procedure, the                     patient's blood pressure, pulse, and oxygen saturations                     were monitored continuously. The Colonoscope was                     introduced through the anus and advanced to the the cecum,                     identified by appendiceal orifice and ileocecal valve. The                     colonoscopy was performed without difficulty. The patient                     tolerated the procedure well. The quality of the bowel                     preparation was adequate to identify polyps. Findings:      A 5 mm polyp was found in the transverse colon. The polyp was sessile.       Biopsies were taken with a cold forceps for  histology.      The retroflexed  view of the distal rectum and anal verge was normal and       showed no anal or rectal abnormalities. Impression:        - One 5 mm polyp in the transverse colon. Biopsied.                    - The distal rectum and anal verge are normal on                     retroflexion view. Recommendation:    - Telephone endoscopist for pathology results in 1 week. Procedure Code(s): --- Professional ---                    [phone], Colonoscopy, flexible; with biopsy, single or                     multiple Diagnosis Code(s): --- Professional ---                    Z12.11, Encounter for screening for malignant neoplasm of                     colon                    D12.3, Benign neoplasm of transverse colon CPT copyright 2014 American Medical Association. All rights reserved. The codes documented in this report are preliminary and upon coder review may  be revised to meet current compliance requirements. Robert Bellow, MD 05/30/2015 10:09:29 AM This report has been signed electronically. Number of Addenda: 0 Note Initiated On: 05/30/2015 9:34 AM Scope Withdrawal Time: 0 hours 12 minutes 14 seconds  Total Procedure Duration: 0 hours 22 minutes 57 seconds       [facility]

## 2015-05-31 ENCOUNTER — Encounter: Payer: Self-pay | Admitting: General Surgery

## 2015-05-31 ENCOUNTER — Telehealth: Payer: Self-pay

## 2015-05-31 LAB — SURGICAL PATHOLOGY

## 2015-05-31 NOTE — Telephone Encounter (Signed)
-----   Message from Robert Bellow, MD sent at 05/31/2015  3:34 PM EST ----- Please notify the patient that the polyp removed was benign but a follow-up exam in 5 years is recommended. ----- Message -----    From: [facility] Interface    Sent: 05/31/2015  11:19 AM      To: Robert Bellow, MD

## 2015-05-31 NOTE — Telephone Encounter (Signed)
Notified patient as instructed, patient pleased. Discussed follow-up in 5 years, patient agrees.  

## 2015-12-13 ENCOUNTER — Ambulatory Visit: Payer: BC Managed Care – PPO | Admitting: Dietician

## 2015-12-17 ENCOUNTER — Encounter: Payer: Self-pay | Admitting: Dietician

## 2015-12-17 ENCOUNTER — Encounter: Payer: BC Managed Care – PPO | Attending: General Surgery | Admitting: Dietician

## 2015-12-17 DIAGNOSIS — E669 Obesity, unspecified: Secondary | ICD-10-CM

## 2015-12-17 DIAGNOSIS — Z9884 Bariatric surgery status: Secondary | ICD-10-CM | POA: Insufficient documentation

## 2015-12-17 NOTE — Progress Notes (Signed)
  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 Charlotte Moses returns with a 9.6 lb weight loss in the past year. Father passed away last week and has been eating foods she doesn't normally eat. Trying to maintain weight and has a routine with when she travels. Would like to lose another 10 lbs. Exercises everyday.   Having trouble tolerating hamburgers though can have ground beef in things. Likes the frozen riced cauliflower. "Cheats" every once in a while. Feels like she could cut back on this to lose the last 10 lbs.    Surgery date: 09/05/2013  Surgery type: Gastric sleeve  Start weight at [facility]: 238 on 06/06/2013  Weight today:175.4 lbs  Weight loss: 9.6 lbs Total weight loss: 62.6 lbs Weight loss goal: 160 lbs   TANITA BODY COMP RESULTS   08/22/13  09/20/13 12/12/13 05/29/14 12/12/14 12/17/15  BMI (kg/m^2)  41.1  37.7 34.2 33.0 31.8 30.1  Fat Mass (lbs)  121.5  108 92.5 86.0 80.0 71.6  Fat Free Mass (lbs)  118  111.5 106.5 106.0 105.0 103.8  Total Body Water (lbs)  86.5  81.5 78.0 77.5 77.0 73.2    Preferred Learning Style:   No preference indicated   Learning Readiness:   Ready  24-hr recall: B (AM): Premier Protein or  (30g) Snk (AM): eggs/omelette with cheese and bacon (14-20 g) L (PM): jerky/nuts when traveling or hard boiled eggs (12 g) Snk (PM): none or olives or 1/4 cup low carb trail mix or dark chocolate  D (PM): 3 oz steak or chicken, or leftovers with vegetable or snack (21 g) Snk (PM): none  Fluid intake: 1-2 protein shakes 11-22 oz, 3 cups of coffee with splenda, 4 glasses flavored water, wine occassionally  (at least 64 oz) Estimated total protein intake: 60-81 g  Medications: off medications Supplementation: taking  CBG monitoring: 2 x week Average CBG per patient: 105 mg/dl Last patient reported  A1c: 5.3% per patient (needs to schedule another appointment)  Using straws: No Drinking while eating: No Hair loss: No Carbonated beverages: No N/V/D/C: constipated every once in a while Dumping syndrome: none except if she is "bad"  Recent physical activity:  Elliptical/biking/hiking  5 x week and has a trainer 2x week   Progress Towards Goal(s):  In progress.    Nutritional Diagnosis:  [place]-3.3 Overweight/obesity related to past poor dietary habits and physical inactivity as evidenced by patient w/ recent Sleeve Gastrectomy surgery following dietary guidelines for continued weight loss.    Intervention:  Nutrition education/Diet reinforcement. Goals:  Follow Phase 3B: High Protein + Non-Starchy Vegetables  Eat 3-6 small meals/snacks, every 3-5 hrs  Increase lean protein foods to meet 60g goal  Increase fluid intake to 64oz +  Avoid drinking 15 minutes before, during and 30 minutes after eating  Keep up the good work!  Teaching Method Utilized:  Visual Auditory Hands on  Barriers to learning/adherence to lifestyle change: none  Demonstrated degree of understanding via:  Teach Back   Monitoring/Evaluation:  Dietary intake, exercise, and body weight. Follow up prn.

## 2015-12-17 NOTE — Patient Instructions (Addendum)
Goals:  Follow Phase 3B: High Protein + Non-Starchy Vegetables  Eat 3-6 small meals/snacks, every 3-5 hrs  Increase lean protein foods to meet 60g goal  Increase fluid intake to 64oz +  Avoid drinking 15 minutes before, during and 30 minutes after eating  Keep up the good work!  Surgery date: 09/05/2013  Surgery type: Gastric sleeve  Start weight at [facility]: 238 on 06/06/2013  Weight today:175.4 lbs  Weight loss: 9.6 lbs Total weight loss: 62.6 lbs Weight loss goal: 160 lbs   TANITA BODY COMP RESULTS   08/22/13  09/20/13 12/12/13 05/29/14 12/12/14 12/17/15  BMI (kg/m^2)  41.1  37.7 34.2 33.0 31.8 30.1  Fat Mass (lbs)  121.5  108 92.5 86.0 80.0 71.6  Fat Free Mass (lbs)  118  111.5 106.5 106.0 105.0 103.8  Total Body Water (lbs)  86.5  81.5 78.0 77.5 77.0 73.2

## 2016-02-29 ENCOUNTER — Encounter (HOSPITAL_COMMUNITY): Payer: Self-pay

## 2016-06-04 LAB — HM PAP SMEAR

## 2016-07-28 ENCOUNTER — Telehealth: Payer: Self-pay

## 2016-07-28 NOTE — Telephone Encounter (Signed)
Pt calling.  Would like bx results from over a week ago.

## 2016-11-17 ENCOUNTER — Encounter: Payer: Self-pay | Admitting: *Deleted

## 2016-11-18 NOTE — H&P (Signed)
See scanned note.

## 2016-11-19 ENCOUNTER — Ambulatory Visit: Payer: BC Managed Care – PPO | Admitting: Certified Registered Nurse Anesthetist

## 2016-11-19 ENCOUNTER — Ambulatory Visit
Admission: RE | Admit: 2016-11-19 | Discharge: 2016-11-19 | Disposition: A | Discharge status: Home / Self Care | Payer: BC Managed Care – PPO | Source: Ambulatory Visit | Attending: Ophthalmology | Admitting: Ophthalmology

## 2016-11-19 ENCOUNTER — Encounter
Admission: RE | Disposition: A | Discharge status: Home / Self Care | Payer: Self-pay | Source: Ambulatory Visit | Attending: Ophthalmology

## 2016-11-19 ENCOUNTER — Encounter: Payer: Self-pay | Admitting: *Deleted

## 2016-11-19 DIAGNOSIS — Z7984 Long term (current) use of oral hypoglycemic drugs: Secondary | ICD-10-CM | POA: Insufficient documentation

## 2016-11-19 DIAGNOSIS — Z87891 Personal history of nicotine dependence: Secondary | ICD-10-CM | POA: Insufficient documentation

## 2016-11-19 DIAGNOSIS — H2512 Age-related nuclear cataract, left eye: Secondary | ICD-10-CM | POA: Insufficient documentation

## 2016-11-19 DIAGNOSIS — J45909 Unspecified asthma, uncomplicated: Secondary | ICD-10-CM | POA: Insufficient documentation

## 2016-11-19 DIAGNOSIS — M1990 Unspecified osteoarthritis, unspecified site: Secondary | ICD-10-CM | POA: Insufficient documentation

## 2016-11-19 DIAGNOSIS — Z91040 Latex allergy status: Secondary | ICD-10-CM | POA: Insufficient documentation

## 2016-11-19 DIAGNOSIS — Z9884 Bariatric surgery status: Secondary | ICD-10-CM | POA: Insufficient documentation

## 2016-11-19 DIAGNOSIS — Z885 Allergy status to narcotic agent status: Secondary | ICD-10-CM | POA: Diagnosis not present

## 2016-11-19 HISTORY — PX: CATARACT EXTRACTION W/PHACO: SHX586

## 2016-11-19 LAB — GLUCOSE, CAPILLARY: GLUCOSE-CAPILLARY: 89 mg/dL (ref 65–99)

## 2016-11-19 SURGERY — PHACOEMULSIFICATION, CATARACT, WITH IOL INSERTION
Anesthesia: General | Site: Eye | Laterality: Left | Wound class: Clean

## 2016-11-19 MED ORDER — SODIUM CHLORIDE 0.9 % IV SOLN
INTRAVENOUS | Status: DC
  Administered 2016-11-19: 07:00:00 via INTRAVENOUS

## 2016-11-19 MED ORDER — TETRACAINE HCL 0.5 % OP SOLN
OPHTHALMIC | Status: DC | PRN
  Administered 2016-11-19: 1 [drp] via OPHTHALMIC

## 2016-11-19 MED ORDER — ALFENTANIL 500 MCG/ML IJ INJ
INJECTION | INTRAVENOUS | Status: AC
  Filled 2016-11-19: qty 5

## 2016-11-19 MED ORDER — ALFENTANIL 500 MCG/ML IJ INJ
INJECTION | INTRAVENOUS | Status: DC | PRN
  Administered 2016-11-19: 500 ug via INTRAVENOUS

## 2016-11-19 MED ORDER — NA CHONDROIT SULF-NA HYALURON 40-17 MG/ML IO SOLN
INTRAOCULAR | Status: AC
  Filled 2016-11-19: qty 1

## 2016-11-19 MED ORDER — BUPIVACAINE HCL (PF) 0.75 % IJ SOLN
INTRAMUSCULAR | Status: AC
  Filled 2016-11-19: qty 10

## 2016-11-19 MED ORDER — CARBACHOL 0.01 % IO SOLN
INTRAOCULAR | Status: DC | PRN
  Administered 2016-11-19: .5 mL via INTRAOCULAR

## 2016-11-19 MED ORDER — LIDOCAINE HCL (PF) 4 % IJ SOLN
INTRAMUSCULAR | Status: DC | PRN
  Administered 2016-11-19: 4 mL via OPHTHALMIC

## 2016-11-19 MED ORDER — CEFUROXIME OPHTHALMIC INJECTION 1 MG/0.1 ML
INJECTION | OPHTHALMIC | Status: DC | PRN
  Administered 2016-11-19: 1 mg via INTRACAMERAL

## 2016-11-19 MED ORDER — PHENYLEPHRINE HCL 10 % OP SOLN
1.0000 [drp] | OPHTHALMIC | Status: AC
  Administered 2016-11-19 (×4): 1 [drp] via OPHTHALMIC

## 2016-11-19 MED ORDER — TETRACAINE HCL 0.5 % OP SOLN
OPHTHALMIC | Status: AC
  Filled 2016-11-19: qty 2

## 2016-11-19 MED ORDER — POVIDONE-IODINE 5 % OP SOLN
OPHTHALMIC | Status: DC | PRN
  Administered 2016-11-19: 1 via OPHTHALMIC

## 2016-11-19 MED ORDER — EPINEPHRINE PF 1 MG/ML IJ SOLN
INTRAMUSCULAR | Status: AC
  Filled 2016-11-19: qty 2

## 2016-11-19 MED ORDER — POVIDONE-IODINE 5 % OP SOLN
OPHTHALMIC | Status: AC
  Filled 2016-11-19: qty 30

## 2016-11-19 MED ORDER — MOXIFLOXACIN HCL 0.5 % OP SOLN
OPHTHALMIC | Status: DC | PRN
  Administered 2016-11-19: 1 [drp] via OPHTHALMIC

## 2016-11-19 MED ORDER — LIDOCAINE HCL (PF) 4 % IJ SOLN
INTRAMUSCULAR | Status: DC | PRN
  Administered 2016-11-19: 2 mL via OPHTHALMIC

## 2016-11-19 MED ORDER — CEFUROXIME OPHTHALMIC INJECTION 1 MG/0.1 ML
INJECTION | OPHTHALMIC | Status: AC
  Filled 2016-11-19: qty 0.1

## 2016-11-19 MED ORDER — NA CHONDROIT SULF-NA HYALURON 40-17 MG/ML IO SOLN
INTRAOCULAR | Status: DC | PRN
  Administered 2016-11-19: 1 mL via INTRAOCULAR

## 2016-11-19 MED ORDER — MIDAZOLAM HCL 2 MG/2ML IJ SOLN
INTRAMUSCULAR | Status: AC
  Filled 2016-11-19: qty 2

## 2016-11-19 MED ORDER — CYCLOPENTOLATE HCL 2 % OP SOLN
OPHTHALMIC | Status: AC
  Administered 2016-11-19: 1 [drp] via OPHTHALMIC
  Filled 2016-11-19: qty 2

## 2016-11-19 MED ORDER — CYCLOPENTOLATE HCL 2 % OP SOLN
1.0000 [drp] | OPHTHALMIC | Status: AC
  Administered 2016-11-19 (×4): 1 [drp] via OPHTHALMIC

## 2016-11-19 MED ORDER — HYALURONIDASE HUMAN 150 UNIT/ML IJ SOLN
INTRAMUSCULAR | Status: AC
  Filled 2016-11-19: qty 1

## 2016-11-19 MED ORDER — MOXIFLOXACIN HCL 0.5 % OP SOLN
OPHTHALMIC | Status: AC
  Administered 2016-11-19: 1 [drp] via OPHTHALMIC
  Filled 2016-11-19: qty 3

## 2016-11-19 MED ORDER — MOXIFLOXACIN HCL 0.5 % OP SOLN
1.0000 [drp] | OPHTHALMIC | Status: DC
  Administered 2016-11-19 (×3): 1 [drp] via OPHTHALMIC

## 2016-11-19 MED ORDER — MIDAZOLAM HCL 2 MG/2ML IJ SOLN
INTRAMUSCULAR | Status: DC | PRN
  Administered 2016-11-19: 0.5 mg via INTRAVENOUS

## 2016-11-19 MED ORDER — PHENYLEPHRINE HCL 10 % OP SOLN
OPHTHALMIC | Status: AC
  Administered 2016-11-19: 1 [drp] via OPHTHALMIC
  Filled 2016-11-19: qty 5

## 2016-11-19 MED ORDER — EPINEPHRINE PF 1 MG/ML IJ SOLN
INTRAMUSCULAR | Status: DC | PRN
  Administered 2016-11-19: 1 mL via OPHTHALMIC

## 2016-11-19 SURGICAL SUPPLY — 24 items
CORD BIP STRL DISP 12FT (MISCELLANEOUS) ×3 IMPLANT
DRAPE XRAY CASSETTE 23X24 (DRAPES) ×3 IMPLANT
ERASER HMR WETFIELD 18G (MISCELLANEOUS) ×3 IMPLANT
GLOVE BIO SURGEON STRL SZ8 (GLOVE) ×3 IMPLANT
GLOVE SURG LX 6.5 MICRO (GLOVE) ×2
GLOVE SURG LX 8.0 MICRO (GLOVE) ×2
GLOVE SURG LX STRL 6.5 MICRO (GLOVE) ×1 IMPLANT
GLOVE SURG LX STRL 8.0 MICRO (GLOVE) ×1 IMPLANT
GOWN STRL REUS W/ TWL LRG LVL3 (GOWN DISPOSABLE) ×1 IMPLANT
GOWN STRL REUS W/ TWL XL LVL3 (GOWN DISPOSABLE) ×1 IMPLANT
GOWN STRL REUS W/TWL LRG LVL3 (GOWN DISPOSABLE) ×2
GOWN STRL REUS W/TWL XL LVL3 (GOWN DISPOSABLE) ×2
LENS IOL ACRSF IQ ULTRA 16.5 (Intraocular Lens) ×1 IMPLANT
LENS IOL ACRYSOF IQ 16.5 (Intraocular Lens) ×3 IMPLANT
PACK CATARACT (MISCELLANEOUS) ×3 IMPLANT
PACK CATARACT DINGLEDEIN LX (MISCELLANEOUS) ×3 IMPLANT
PACK EYE AFTER SURG (MISCELLANEOUS) ×3 IMPLANT
SHLD EYE VISITEC  UNIV (MISCELLANEOUS) ×3 IMPLANT
SOL BSS BAG (MISCELLANEOUS) ×3
SOLUTION BSS BAG (MISCELLANEOUS) ×1 IMPLANT
SUT SILK 5-0 (SUTURE) ×3 IMPLANT
SYR 5ML LL (SYRINGE) ×3 IMPLANT
WATER STERILE IRR 250ML POUR (IV SOLUTION) ×3 IMPLANT
WIPE NON LINTING 3.25X3.25 (MISCELLANEOUS) ×3 IMPLANT

## 2016-11-19 NOTE — Anesthesia Procedure Notes (Signed)
Date/Time: 11/19/2016 8:34 AM Performed by: Darlyne Russian Pre-anesthesia Checklist: Patient identified, Emergency Drugs available, Suction available, Patient being monitored and Timeout performed Patient Re-evaluated:Patient Re-evaluated prior to inductionOxygen Delivery Method: Nasal cannula Placement Confirmation: positive ETCO2

## 2016-11-19 NOTE — Op Note (Signed)
Date of Surgery: 11/19/2016 Date of Dictation: 11/19/2016 9:09 AM Pre-operative Diagnosis:  Nuclear Sclerotic Cataract left Eye Post-operative Diagnosis: same Procedure performed: Extra-capsular Cataract Extraction (ECCE) with placement of a posterior chamber intraocular lens (IOL) left Eye IOL:  Implant Name Type Inv. Item Serial No. Manufacturer Lot No. LRB No. Used  LENS IOL ACRYSOF IQ 16.5 - [id number] Intraocular Lens LENS IOL ACRYSOF IQ 16.5 [id number] ALCON   Left 1   Anesthesia: 2% Lidocaine and 4% Marcaine in a 50/50 mixture with 10 unites/ml of Hylenex given as a peribulbar Anesthesiologist: Anesthesiologist: Alvin Critchley, MD CRNA: Darlyne Russian, CRNA Complications: none Estimated Blood Loss: less than 1 ml  Description of procedure:  The patient was given anesthesia and sedation via intravenous access. The patient was then prepped and draped in the usual fashion. A 25-gauge needle was bent for initiating the capsulorhexis. A 5-0 silk suture was placed through the conjunctiva superior and inferiorly to serve as bridle sutures. Hemostasis was obtained at the superior limbus using an eraser cautery. A partial thickness groove was made at the anterior surgical limbus with a 64 Beaver blade and this was dissected anteriorly with an Avaya. The anterior chamber was entered at 10 o'clock with a 1.0 mm paracentesis knife and through the lamellar dissection with a 2.6 mm Alcon keratome. Epi-Shugarcaine 0.5 CC [9 cc BSS Plus (Alcon), 3 cc 4% preservative-free lidocaine (Hospira) and 4 cc 1:1000 preservative-free, bisulfite-free epinephrine] was injected into the anterior chamber via the paracentesis tract. Epi-Shugarcaine 0.5 CC [9 cc BSS Plus (Alcon), 3 cc 4% preservative-free lidocaine (Hospira) and 4 cc 1:1000 preservative-free, bisulfite-free epinephrine] was injected into the anterior chamber via the paracentesis tract. DiscoVisc was injected to replace the aqueous and a  continuous tear curvilinear capsulorhexis was performed using a bent 25-gauge needle.  Balance salt on a syringe was used to perform hydro-dissection and phacoemulsification was carried out using a divide and conquer technique. Procedure(s) with comments: CATARACT EXTRACTION PHACO AND INTRAOCULAR LENS PLACEMENT (IOC) (Left) - [place] 00:57 AP% 23.8 CDE 28.01 Fluid pack lot # [id number]. Irrigation/aspiration was used to remove the residual cortex and the capsular bag was inflated with DiscoVisc. The intraocular lens was inserted into the capsular bag using a pre-loaded UltraSert Delivery System. Irrigation/aspiration was used to remove the residual DiscoVisc. The wound was inflated with balanced salt and checked for leaks. None were found. Miostat was injected via the paracentesis track and 0.1 ml of cefuroxime containing 1 mg of drug  was injected via the paracentesis track. The wound was checked for leaks again and none were found.   The bridal sutures were removed and two drops of Vigamox were placed on the eye. An eye shield was placed to protect the eye and the patient was discharged to the recovery area in good condition.   Sharanda Shinault MD

## 2016-11-19 NOTE — Transfer of Care (Addendum)
Immediate Anesthesia Transfer of Care Note  Patient: Charlotte Moses  Procedure(s) Performed: Procedure(s) with comments: CATARACT EXTRACTION PHACO AND INTRAOCULAR LENS PLACEMENT (IOC) (Left) - [place] 00:57 AP% 23.8 CDE 28.01 Fluid pack lot # [id number]  Patient Location: PACU  Anesthesia Type:General  Level of Consciousness: awake, alert , oriented and patient cooperative  Airway & Oxygen Therapy: Patient Spontanous Breathing  Post-op Assessment: Report given to RN and Post -op Vital signs reviewed and stable  Post vital signs: Reviewed and stable  Last Vitals:  Vitals:   11/19/16 0911 11/19/16 0912  BP: (!) 155/75 (!) 155/75  Pulse: (!) 50 (!) 48  Resp: 16 16  Temp:  36.8 C    Last Pain:  Vitals:   11/19/16 0912  TempSrc: Temporal         Complications: No apparent anesthesia complications

## 2016-11-19 NOTE — Anesthesia Post-op Follow-up Note (Cosign Needed)
Anesthesia QCDR form completed.        

## 2016-11-19 NOTE — Anesthesia Preprocedure Evaluation (Signed)
Anesthesia Evaluation  Patient identified by MRN, date of birth, ID band Patient awake    Reviewed: Allergy & Precautions, H&P , NPO status , Patient's Chart, lab work & pertinent test results  Airway Mallampati: II  TM Distance: >3 FB Neck ROM: Full    Dental no notable dental hx. (+) Caps   Pulmonary asthma , former smoker,    Pulmonary exam normal breath sounds clear to auscultation       Cardiovascular negative cardio ROS Normal cardiovascular exam Rhythm:Regular Rate:Normal     Neuro/Psych negative neurological ROS  negative psych ROS   GI/Hepatic negative GI ROS, Neg liver ROS,   Endo/Other  negative endocrine ROSdiabetes, Type 2, Oral Hypoglycemic Agents  Renal/GU negative Renal ROS  negative genitourinary   Musculoskeletal negative musculoskeletal ROS (+) Arthritis , Osteoarthritis,    Abdominal   Peds negative pediatric ROS (+)  Hematology negative hematology ROS (+)   Anesthesia Other Findings   Reproductive/Obstetrics negative OB ROS                             Anesthesia Physical  Anesthesia Plan  ASA: III  Anesthesia Plan: General   Post-op Pain Management:    Induction: Intravenous  PONV Risk Score and Plan:   Airway Management Planned: Nasal Cannula  Additional Equipment:   Intra-op Plan:   Post-operative Plan:   Informed Consent: I have reviewed the patients History and Physical, chart, labs and discussed the procedure including the risks, benefits and alternatives for the proposed anesthesia with the patient or authorized representative who has indicated his/her understanding and acceptance.   Dental advisory given  Plan Discussed with: CRNA  Anesthesia Plan Comments:         Anesthesia Quick Evaluation

## 2016-11-19 NOTE — Anesthesia Postprocedure Evaluation (Signed)
Anesthesia Post Note  Patient: Charlotte Moses  Procedure(s) Performed: Procedure(s) (LRB): CATARACT EXTRACTION PHACO AND INTRAOCULAR LENS PLACEMENT (IOC) (Left)  Patient location during evaluation: PACU Anesthesia Type: General Level of consciousness: awake and alert and oriented Pain management: pain level controlled Vital Signs Assessment: post-procedure vital signs reviewed and stable Respiratory status: spontaneous breathing Cardiovascular status: blood pressure returned to baseline Anesthetic complications: no     Last Vitals:  Vitals:   11/19/16 0912 11/19/16 0928  BP: (!) 155/75 (!) 142/71  Pulse: (!) 48 (!) 47  Resp: 16   Temp: 36.8 C     Last Pain:  Vitals:   11/19/16 0912  TempSrc: Temporal                 Clayvon Parlett

## 2016-11-19 NOTE — Discharge Instructions (Addendum)
Eye Surgery Discharge Instructions  Expect mild scratchy sensation or mild soreness. DO NOT RUB YOUR EYE!  The day of surgery:  Minimal physical activity, but bed rest is not required  No reading, computer work, or close hand work  No bending, lifting, or straining.  May watch TV  For 24 hours:  No driving, legal decisions, or alcoholic beverages  Safety precautions  Eat anything you prefer: It is better to start with liquids, then soup then solid foods.  _____ Eye patch should be worn until postoperative exam tomorrow.  ____ Solar shield eyeglasses should be worn for comfort in the sunlight/patch while sleeping  Resume all regular medications including aspirin or Coumadin if these were discontinued prior to surgery. You may shower, bathe, shave, or wash your hair. Tylenol may be taken for mild discomfort.  Call your doctor if you experience significant pain, nausea, or vomiting, fever > 101 or other signs of infection. [phone] or [phone] Specific instructions:  Follow-up Information    Dingeldein, Remo Lipps, MD Follow up.   Specialty:  Ophthalmology Why:  [phone] at 10:45 Contact information: [place] [address] [phone]          Eye Surgery Discharge Instructions  Expect mild scratchy sensation or mild soreness. DO NOT RUB YOUR EYE!  The day of surgery:  Minimal physical activity, but bed rest is not required  No reading, computer work, or close hand work  No bending, lifting, or straining.  May watch TV  For 24 hours:  No driving, legal decisions, or alcoholic beverages  Safety precautions  Eat anything you prefer: It is better to start with liquids, then soup then solid foods.  _____ Eye patch should be worn until postoperative exam tomorrow.  ____ Solar shield eyeglasses should be worn for comfort in the sunlight/patch while sleeping  Resume all regular medications including aspirin or Coumadin if these  were discontinued prior to surgery. You may shower, bathe, shave, or wash your hair. Tylenol may be taken for mild discomfort.  Call your doctor if you experience significant pain, nausea, or vomiting, fever > 101 or other signs of infection. [phone] or [phone] Specific instructions:  Follow-up Information    Dingeldein, Remo Lipps, MD Follow up.   Specialty:  Ophthalmology Why:  [phone] at 10:45 Contact information: [address]   [place] [address] [phone]         See handout.

## 2016-11-19 NOTE — Interval H&P Note (Signed)
History and Physical Interval Note:  11/19/2016 7:32 AM  Charlotte Moses  has presented today for surgery, with the diagnosis of CATARACT  The various methods of treatment have been discussed with the patient and family. After consideration of risks, benefits and other options for treatment, the patient has consented to  Procedure(s): CATARACT EXTRACTION PHACO AND INTRAOCULAR LENS PLACEMENT ([place]) (Left) as a surgical intervention .  The patient's history has been reviewed, patient examined, no change in status, stable for surgery.  I have reviewed the patient's chart and labs.  Questions were answered to the patient's satisfaction.     Rashon Westrup

## 2017-02-24 ENCOUNTER — Telehealth: Payer: Self-pay

## 2017-02-24 NOTE — Telephone Encounter (Signed)
Received a surgery clearance form from [facility] for patient's upcoming surgery 03/19/17. Patient was last seen 05/2014. Please call and schedule patient an appointment for surgery clearance.

## 2017-02-24 NOTE — Telephone Encounter (Signed)
Patient scheduled for 03/04/17.

## 2017-02-27 ENCOUNTER — Other Ambulatory Visit: Payer: Self-pay | Admitting: Orthopedic Surgery

## 2017-03-02 ENCOUNTER — Ambulatory Visit: Payer: Self-pay | Admitting: Unknown Physician Specialty

## 2017-03-04 ENCOUNTER — Encounter: Payer: Self-pay | Admitting: Unknown Physician Specialty

## 2017-03-04 ENCOUNTER — Ambulatory Visit (INDEPENDENT_AMBULATORY_CARE_PROVIDER_SITE_OTHER): Payer: BC Managed Care – PPO | Admitting: Unknown Physician Specialty

## 2017-03-04 VITALS — BP 123/77 | HR 56 | Temp 98.4°F | Ht 63.8 in | Wt 195.0 lb

## 2017-03-04 DIAGNOSIS — Z01818 Encounter for other preprocedural examination: Secondary | ICD-10-CM

## 2017-03-04 NOTE — Progress Notes (Signed)
   BP 123/77   Pulse (!) 56   Temp 98.4 F (36.9 C)   Ht 5' 3.8" (1.621 m)   Wt 195 lb (88.5 kg)   SpO2 98%   BMI 33.68 kg/m    Subjective:    Patient ID: Charlotte Moses, female    DOB: August 05, 1955, 61 y.o.   MRN: [id number]  HPI: Charlotte Moses is a 61 y.o. female  Chief Complaint  Patient presents with  . Surgery Clearance   Pt does not take any medications.  History of DM and IFG.  BS was 103 this AM.   Earlier this year it was 5.7% Last glucose was 89.    Relevant past medical, surgical, family and social history reviewed and updated as indicated. Interim medical history since our last visit reviewed. Allergies and medications reviewed and updated.  Review of Systems  Per HPI unless specifically indicated above     Objective:    BP 123/77   Pulse (!) 56   Temp 98.4 F (36.9 C)   Ht 5' 3.8" (1.621 m)   Wt 195 lb (88.5 kg)   SpO2 98%   BMI 33.68 kg/m   Wt Readings from Last 3 Encounters:  03/04/17 195 lb (88.5 kg)  11/17/16 180 lb (81.6 kg)  12/17/15 175 lb 6.4 oz (79.6 kg)    Physical Exam  Constitutional: She is oriented to person, place, and time. She appears well-developed and well-nourished. No distress.  HENT:  Head: Normocephalic and atraumatic.  Eyes: Conjunctivae and lids are normal. Right eye exhibits no discharge. Left eye exhibits no discharge. No scleral icterus.  Neck: Normal range of motion. Neck supple. No JVD present. Carotid bruit is not present.  Cardiovascular: Normal rate, regular rhythm and normal heart sounds.   Pulmonary/Chest: Effort normal and breath sounds normal.  Abdominal: Normal appearance. There is no splenomegaly or hepatomegaly.  Neurological: She is alert and oriented to person, place, and time.  Skin: Skin is warm, dry and intact. No rash noted. No pallor.  Psychiatric: She has a normal mood and affect. Her behavior is normal. Judgment and thought content normal.   EKG shows sinus bradycardia  Results for orders placed or  performed during the hospital encounter of 11/19/16  Glucose, capillary  Result Value Ref Range   Glucose-Capillary 89 65 - 99 mg/dL      Assessment & Plan:   Problem List Items Addressed This Visit    None    Visit Diagnoses    Preoperative clearance    -  Primary   Pt OK for surgery.  Will check CBC, BMP, Hgb A1C, PT/INR   Relevant Orders   EKG 12-Lead (Completed)   Comprehensive metabolic panel   Hgb G9J w/o eAG   INR/PT   CBC with Differential/Platelet   Basic Metabolic Panel (BMET)       Follow up plan: No Follow-up on file.

## 2017-03-05 LAB — CBC WITH DIFFERENTIAL/PLATELET
BASOS ABS: 0.1 10*3/uL (ref 0.0–0.2)
Basos: 1 %
EOS (ABSOLUTE): 0.5 10*3/uL — AB (ref 0.0–0.4)
Eos: 4 %
Hematocrit: 41.7 % (ref 34.0–46.6)
Hemoglobin: 13.6 g/dL (ref 11.1–15.9)
Immature Grans (Abs): 0 10*3/uL (ref 0.0–0.1)
Immature Granulocytes: 0 %
LYMPHS: 26 %
Lymphocytes Absolute: 2.8 10*3/uL (ref 0.7–3.1)
MCH: 27.4 pg (ref 26.6–33.0)
MCHC: 32.6 g/dL (ref 31.5–35.7)
MCV: 84 fL (ref 79–97)
Monocytes Absolute: 0.8 10*3/uL (ref 0.1–0.9)
Monocytes: 7 %
NEUTROS ABS: 6.8 10*3/uL (ref 1.4–7.0)
Neutrophils: 62 %
PLATELETS: 259 10*3/uL (ref 150–379)
RBC: 4.97 x10E6/uL (ref 3.77–5.28)
RDW: 14.4 % (ref 12.3–15.4)
WBC: 10.9 10*3/uL — AB (ref 3.4–10.8)

## 2017-03-05 LAB — COMPREHENSIVE METABOLIC PANEL
A/G RATIO: 1.8 (ref 1.2–2.2)
ALT: 10 IU/L (ref 0–32)
AST: 18 IU/L (ref 0–40)
Albumin: 4.4 g/dL (ref 3.6–4.8)
Alkaline Phosphatase: 75 IU/L (ref 39–117)
BUN/Creatinine Ratio: 30 — ABNORMAL HIGH (ref 12–28)
BUN: 21 mg/dL (ref 8–27)
Bilirubin Total: 0.3 mg/dL (ref 0.0–1.2)
CALCIUM: 9.4 mg/dL (ref 8.7–10.3)
CO2: 22 mmol/L (ref 20–29)
CREATININE: 0.71 mg/dL (ref 0.57–1.00)
Chloride: 104 mmol/L (ref 96–106)
GFR calc Af Amer: 106 mL/min/{1.73_m2} (ref >59)
GFR, EST NON AFRICAN AMERICAN: 92 mL/min/{1.73_m2} (ref >59)
Globulin, Total: 2.5 g/dL (ref 1.5–4.5)
Glucose: 94 mg/dL (ref 65–99)
POTASSIUM: 4.5 mmol/L (ref 3.5–5.2)
Sodium: 141 mmol/L (ref 134–144)
Total Protein: 6.9 g/dL (ref 6.0–8.5)

## 2017-03-05 LAB — PROTIME-INR

## 2017-03-05 LAB — HGB A1C W/O EAG: Hgb A1c MFr Bld: 5.8 % — ABNORMAL HIGH (ref 4.8–5.6)

## 2017-03-06 ENCOUNTER — Encounter
Admission: RE | Admit: 2017-03-06 | Discharge: 2017-03-06 | Disposition: A | Discharge status: Home / Self Care | Payer: BC Managed Care – PPO | Source: Ambulatory Visit | Attending: Orthopedic Surgery | Admitting: Orthopedic Surgery

## 2017-03-06 NOTE — Patient Instructions (Signed)
Your procedure is scheduled on: 03-19-17 Report to Same Day Surgery 2nd floor medical mall [facility] Entrance-take elevator on left to 2nd floor.  Check in with surgery information desk.) To find out your arrival time please call [phone] between 1PM - 3PM on 03-18-17  Remember: Instructions that are not followed completely may result in serious medical risk, up to and including death, or upon the discretion of your surgeon and anesthesiologist your surgery may need to be rescheduled.    _x___ 1. Do not eat food after midnight the night before your procedure. You may drink clear liquids up to 2 hours before you are scheduled to arrive at the hospital for your procedure.  Do not drink clear liquids within 2 hours of your scheduled arrival to the hospital.  Clear liquids include  --Water or Apple juice without pulp  --Clear carbohydrate beverage such as ClearFast or Gatorade  --Black Coffee or Clear Tea (No milk, no creamers, do not add anything to  the coffee or Tea Type 1 and type 2 diabetics should only drink water.  No gum chewing or hard candies.     __x__ 2. No Alcohol for 24 hours before or after surgery.   __x__3. No Smoking for 24 prior to surgery.   ____  4. Bring all medications with you on the day of surgery if instructed.    __x__ 5. Notify your doctor if there is any change in your medical condition     (cold, fever, infections).     Do not wear jewelry, make-up, hairpins, clips or nail polish.  Do not wear lotions, powders, or perfumes. You may wear deodorant.  Do not shave 48 hours prior to surgery. Men may shave face and neck.  Do not bring valuables to the hospital.    [facility] is not responsible for any belongings or valuables.               Contacts, dentures or bridgework may not be worn into surgery.  Leave your suitcase in the car. After surgery it may be brought to your room.  For patients admitted to the hospital, discharge time is determined by  your treatment team.   Patients discharged the day of surgery will not be allowed to drive home.  You will need someone to drive you home and stay with you the night of your procedure.    Please read over the following fact sheets that you were given:   [facility] Preparing for Surgery and or MRSA Information   ____ Take anti-hypertensive listed below, cardiac, seizure, asthma, anti-reflux and psychiatric medicines. These include:  1. NONE   2.  3.  4.  5.  6.  ____Fleets enema or Magnesium Citrate as directed.   ____ Use CHG Soap or sage wipes as directed on instruction sheet   ____ Use inhalers on the day of surgery and bring to hospital day of surgery  ____ Stop Metformin and Janumet 2 days prior to surgery.    ____ Take 1/2 of usual insulin dose the night before surgery and none on the morning     surgery.   ____ Follow recommendations from Cardiologist, Pulmonologist or PCP regarding stopping Aspirin, Coumadin, Plavix ,Eliquis, Effient, or Pradaxa, and Pletal.  X____Stop Anti-inflammatories such as Advil, Aleve, Ibuprofen, Motrin, Naproxen, Naprosyn, Goodies powders or aspirin products NOW-OK to take Tylenol   _x___ Stop supplements until after surgery-STOP OCCUVITE AND BIOTIN 7 DAYS PRIOR TO SURGERY-MAY RESUME AFTER SURGERY  ____ Bring C-Pap to the hospital.

## 2017-03-06 NOTE — Pre-Procedure Instructions (Signed)
Progress Notes Encounter Date: 03/04/2017 Kathrine Haddock, NP  Family Medicine    [] Hide copied text [] Hover for attribution information   BP 123/77   Pulse (!) 56   Temp 98.4 F (36.9 C)   Ht 5' 3.8" (1.621 m)   Wt 195 lb (88.5 kg)   SpO2 98%   BMI 33.68 kg/m    Subjective:    Patient ID: Charlotte Moses, female    DOB: Jun 30, 1955, 61 y.o.   MRN: [id number]  HPI: Charlotte Moses is a 61 y.o. female     Chief Complaint  Patient presents with  . Surgery Clearance   Pt does not take any medications.  History of DM and IFG.  BS was 103 this AM.   Earlier this year it was 5.7% Last glucose was 89.    Relevant past medical, surgical, family and social history reviewed and updated as indicated. Interim medical history since our last visit reviewed. Allergies and medications reviewed and updated.  Review of Systems  Per HPI unless specifically indicated above     Objective:  BP 123/77   Pulse (!) 56   Temp 98.4 F (36.9 C)   Ht 5' 3.8" (1.621 m)   Wt 195 lb (88.5 kg)   SpO2 98%   BMI 33.68 kg/m      Wt Readings from Last 3 Encounters:  03/04/17 195 lb (88.5 kg)  11/17/16 180 lb (81.6 kg)  12/17/15 175 lb 6.4 oz (79.6 kg)    Physical Exam  Constitutional: She is oriented to person, place, and time. She appears well-developed and well-nourished. No distress.  HENT:  Head: Normocephalic and atraumatic.  Eyes: Conjunctivae and lids are normal. Right eye exhibits no discharge. Left eye exhibits no discharge. No scleral icterus.  Neck: Normal range of motion. Neck supple. No JVD present. Carotid bruit is not present.  Cardiovascular: Normal rate, regular rhythm and normal heart sounds.   Pulmonary/Chest: Effort normal and breath sounds normal.  Abdominal: Normal appearance. There is no splenomegaly or hepatomegaly.  Neurological: She is alert and oriented to person, place, and time.  Skin: Skin is warm, dry and intact. No rash noted. No pallor.  Psychiatric:  She has a normal mood and affect. Her behavior is normal. Judgment and thought content normal.   EKG shows sinus bradycardia       Results for orders placed or performed during the hospital encounter of 11/19/16  Glucose, capillary  Result Value Ref Range   Glucose-Capillary 89 65 - 99 mg/dL      Assessment & Plan:      Problem List Items Addressed This Visit    None          Visit Diagnoses    Preoperative clearance    -  Primary   Pt OK for surgery.  Will check CBC, BMP, Hgb A1C, PT/INR   Relevant Orders   EKG 12-Lead (Completed)   Comprehensive metabolic panel   Hgb R7E w/o eAG   INR/PT   CBC with Differential/Platelet   Basic Metabolic Panel (BMET)       Follow up plan: No Follow-up on file.         Electronically signed by Kathrine Haddock, NP at 03/04/2017 4:13 PM      Office Visit on 03/04/2017        Detailed Report

## 2017-03-18 NOTE — Pre-Procedure Instructions (Signed)
MEDICAL CLEARANCE IN EPIC AND ON CHART-LOW RISK

## 2017-03-19 ENCOUNTER — Ambulatory Visit: Payer: BC Managed Care – PPO | Admitting: Anesthesiology

## 2017-03-19 ENCOUNTER — Ambulatory Visit
Admission: RE | Admit: 2017-03-19 | Discharge: 2017-03-19 | Disposition: A | Discharge status: Home / Self Care | Payer: BC Managed Care – PPO | Source: Ambulatory Visit | Attending: Orthopedic Surgery | Admitting: Orthopedic Surgery

## 2017-03-19 ENCOUNTER — Encounter
Admission: RE | Disposition: A | Discharge status: Home / Self Care | Payer: Self-pay | Source: Ambulatory Visit | Attending: Orthopedic Surgery

## 2017-03-19 DIAGNOSIS — S83242A Other tear of medial meniscus, current injury, left knee, initial encounter: Secondary | ICD-10-CM | POA: Diagnosis not present

## 2017-03-19 DIAGNOSIS — M1712 Unilateral primary osteoarthritis, left knee: Secondary | ICD-10-CM | POA: Insufficient documentation

## 2017-03-19 DIAGNOSIS — Z87891 Personal history of nicotine dependence: Secondary | ICD-10-CM | POA: Insufficient documentation

## 2017-03-19 DIAGNOSIS — Z7984 Long term (current) use of oral hypoglycemic drugs: Secondary | ICD-10-CM | POA: Insufficient documentation

## 2017-03-19 DIAGNOSIS — X58XXXA Exposure to other specified factors, initial encounter: Secondary | ICD-10-CM | POA: Insufficient documentation

## 2017-03-19 DIAGNOSIS — E1136 Type 2 diabetes mellitus with diabetic cataract: Secondary | ICD-10-CM | POA: Diagnosis not present

## 2017-03-19 DIAGNOSIS — J45909 Unspecified asthma, uncomplicated: Secondary | ICD-10-CM | POA: Insufficient documentation

## 2017-03-19 HISTORY — PX: KNEE ARTHROSCOPY WITH MEDIAL MENISECTOMY: SHX5651

## 2017-03-19 LAB — GLUCOSE, CAPILLARY: GLUCOSE-CAPILLARY: 131 mg/dL — AB (ref 65–99)

## 2017-03-19 LAB — PROTIME-INR
INR: 1.02
PROTHROMBIN TIME: 13.3 s (ref 11.4–15.2)

## 2017-03-19 LAB — APTT: aPTT: 27 seconds (ref 24–36)

## 2017-03-19 SURGERY — ARTHROSCOPY, KNEE, WITH MEDIAL MENISCECTOMY
Anesthesia: General | Laterality: Left

## 2017-03-19 MED ORDER — ONDANSETRON HCL 4 MG/2ML IJ SOLN
INTRAMUSCULAR | Status: AC
  Filled 2017-03-19: qty 2

## 2017-03-19 MED ORDER — FAMOTIDINE 20 MG PO TABS
ORAL_TABLET | ORAL | Status: AC
  Administered 2017-03-19: 20 mg via ORAL
  Filled 2017-03-19: qty 1

## 2017-03-19 MED ORDER — LIDOCAINE HCL (CARDIAC) 20 MG/ML IV SOLN
INTRAVENOUS | Status: DC | PRN
  Administered 2017-03-19: 100 mg via INTRAVENOUS

## 2017-03-19 MED ORDER — FENTANYL CITRATE (PF) 100 MCG/2ML IJ SOLN
INTRAMUSCULAR | Status: DC | PRN
  Administered 2017-03-19 (×2): 25 ug via INTRAVENOUS
  Administered 2017-03-19 (×3): 50 ug via INTRAVENOUS

## 2017-03-19 MED ORDER — ONDANSETRON HCL 4 MG/2ML IJ SOLN: 4.0000 mg | Freq: Once | INTRAMUSCULAR | Status: DC | PRN

## 2017-03-19 MED ORDER — LIDOCAINE HCL (PF) 2 % IJ SOLN
INTRAMUSCULAR | Status: AC
  Filled 2017-03-19: qty 10

## 2017-03-19 MED ORDER — PHENYLEPHRINE HCL 10 MG/ML IJ SOLN
INTRAMUSCULAR | Status: AC
  Filled 2017-03-19: qty 1

## 2017-03-19 MED ORDER — ASPIRIN EC 325 MG PO TBEC: 325.0000 mg | DELAYED_RELEASE_TABLET | Freq: Every day | ORAL | 0 refills | Status: DC

## 2017-03-19 MED ORDER — SUCCINYLCHOLINE CHLORIDE 20 MG/ML IJ SOLN
INTRAMUSCULAR | Status: AC
  Filled 2017-03-19: qty 1

## 2017-03-19 MED ORDER — HYDROCODONE-ACETAMINOPHEN 5-325 MG PO TABS: 1.0000 | ORAL_TABLET | Freq: Four times a day (QID) | ORAL | Status: DC | PRN

## 2017-03-19 MED ORDER — CEFAZOLIN SODIUM-DEXTROSE 2-4 GM/100ML-% IV SOLN
INTRAVENOUS | Status: AC
  Filled 2017-03-19: qty 100

## 2017-03-19 MED ORDER — FENTANYL CITRATE (PF) 100 MCG/2ML IJ SOLN
INTRAMUSCULAR | Status: AC
  Filled 2017-03-19: qty 2

## 2017-03-19 MED ORDER — PROPOFOL 10 MG/ML IV BOLUS
INTRAVENOUS | Status: DC | PRN
  Administered 2017-03-19: 200 mg via INTRAVENOUS

## 2017-03-19 MED ORDER — LIDOCAINE HCL (PF) 1 % IJ SOLN
INTRAMUSCULAR | Status: AC
  Filled 2017-03-19: qty 30

## 2017-03-19 MED ORDER — MIDAZOLAM HCL 2 MG/2ML IJ SOLN
INTRAMUSCULAR | Status: DC | PRN
  Administered 2017-03-19: 2 mg via INTRAVENOUS

## 2017-03-19 MED ORDER — GLYCOPYRROLATE 0.2 MG/ML IJ SOLN
INTRAMUSCULAR | Status: AC
  Filled 2017-03-19: qty 1

## 2017-03-19 MED ORDER — DEXAMETHASONE SODIUM PHOSPHATE 10 MG/ML IJ SOLN
INTRAMUSCULAR | Status: DC | PRN
  Administered 2017-03-19: 10 mg via INTRAVENOUS

## 2017-03-19 MED ORDER — CHLORHEXIDINE GLUCONATE CLOTH 2 % EX PADS: 6.0000 | MEDICATED_PAD | Freq: Once | CUTANEOUS | Status: DC

## 2017-03-19 MED ORDER — ACETAMINOPHEN 10 MG/ML IV SOLN
INTRAVENOUS | Status: AC
  Filled 2017-03-19: qty 100

## 2017-03-19 MED ORDER — EPHEDRINE SULFATE 50 MG/ML IJ SOLN
INTRAMUSCULAR | Status: AC
  Filled 2017-03-19: qty 1

## 2017-03-19 MED ORDER — MIDAZOLAM HCL 2 MG/2ML IJ SOLN
INTRAMUSCULAR | Status: AC
  Filled 2017-03-19: qty 2

## 2017-03-19 MED ORDER — LIDOCAINE HCL (PF) 1 % IJ SOLN
INTRAMUSCULAR | Status: DC | PRN
  Administered 2017-03-19: 8 mL

## 2017-03-19 MED ORDER — BUPIVACAINE-EPINEPHRINE (PF) 0.25% -1:200000 IJ SOLN
INTRAMUSCULAR | Status: AC
  Filled 2017-03-19: qty 30

## 2017-03-19 MED ORDER — PROPOFOL 10 MG/ML IV BOLUS
INTRAVENOUS | Status: AC
  Filled 2017-03-19: qty 20

## 2017-03-19 MED ORDER — DEXAMETHASONE SODIUM PHOSPHATE 10 MG/ML IJ SOLN
INTRAMUSCULAR | Status: AC
  Filled 2017-03-19: qty 1

## 2017-03-19 MED ORDER — LACTATED RINGERS IV SOLN
INTRAVENOUS | Status: DC
  Administered 2017-03-19: 06:00:00 via INTRAVENOUS

## 2017-03-19 MED ORDER — BUPIVACAINE-EPINEPHRINE (PF) 0.25% -1:200000 IJ SOLN
INTRAMUSCULAR | Status: DC | PRN
  Administered 2017-03-19: 20 mL

## 2017-03-19 MED ORDER — CEFAZOLIN SODIUM-DEXTROSE 2-4 GM/100ML-% IV SOLN
2.0000 g | INTRAVENOUS | Status: AC
  Administered 2017-03-19: 2 g via INTRAVENOUS

## 2017-03-19 MED ORDER — ONDANSETRON HCL 4 MG PO TABS: 4.0000 mg | ORAL_TABLET | Freq: Three times a day (TID) | ORAL | 0 refills | Status: DC | PRN

## 2017-03-19 MED ORDER — GLYCOPYRROLATE 0.2 MG/ML IJ SOLN
INTRAMUSCULAR | Status: DC | PRN
  Administered 2017-03-19: 0.2 mg via INTRAVENOUS

## 2017-03-19 MED ORDER — FENTANYL CITRATE (PF) 100 MCG/2ML IJ SOLN: 25.0000 ug | INTRAMUSCULAR | Status: DC | PRN

## 2017-03-19 MED ORDER — ACETAMINOPHEN 10 MG/ML IV SOLN
INTRAVENOUS | Status: DC | PRN
  Administered 2017-03-19: 1000 mg via INTRAVENOUS

## 2017-03-19 MED ORDER — HYDROCODONE-ACETAMINOPHEN 5-325 MG PO TABS: 1.0000 | ORAL_TABLET | Freq: Four times a day (QID) | ORAL | 0 refills | Status: DC | PRN

## 2017-03-19 MED ORDER — ONDANSETRON HCL 4 MG/2ML IJ SOLN
INTRAMUSCULAR | Status: DC | PRN
  Administered 2017-03-19: 4 mg via INTRAVENOUS

## 2017-03-19 MED ORDER — FAMOTIDINE 20 MG PO TABS
20.0000 mg | ORAL_TABLET | Freq: Once | ORAL | Status: AC
  Administered 2017-03-19: 20 mg via ORAL

## 2017-03-19 SURGICAL SUPPLY — 45 items
ADAPTER IRRIG TUBE 2 SPIKE SOL (ADAPTER) ×6 IMPLANT
BUR RADIUS 3.5 (BURR) ×3 IMPLANT
BUR RADIUS 4.0X18.5 (BURR) ×3 IMPLANT
CANISTER SUCT LVC 12 LTR MEDI- (MISCELLANEOUS) IMPLANT
CLOSURE WOUND 1/2 X4 (GAUZE/BANDAGES/DRESSINGS) ×1
COOLER POLAR GLACIER W/PUMP (MISCELLANEOUS) IMPLANT
CUFF TOURN 24 STER (MISCELLANEOUS) IMPLANT
CUFF TOURN 30 STER DUAL PORT (MISCELLANEOUS) ×3 IMPLANT
DEVICE SUCT BLK HOLE OR FLOOR (MISCELLANEOUS) ×3 IMPLANT
DRAPE IMP U-DRAPE 54X76 (DRAPES) ×3 IMPLANT
DRAPE SHEET LG 3/4 BI-LAMINATE (DRAPES) ×3 IMPLANT
DURAPREP 26ML APPLICATOR (WOUND CARE) ×6 IMPLANT
GAUZE PETRO XEROFOAM 1X8 (MISCELLANEOUS) ×3 IMPLANT
GAUZE SPONGE 4X4 12PLY STRL (GAUZE/BANDAGES/DRESSINGS) ×3 IMPLANT
GLOVE BIOGEL PI IND STRL 7.0 (GLOVE) ×3 IMPLANT
GLOVE BIOGEL PI IND STRL 9 (GLOVE) ×1 IMPLANT
GLOVE BIOGEL PI INDICATOR 7.0 (GLOVE) ×6
GLOVE BIOGEL PI INDICATOR 9 (GLOVE) ×2
GLOVE SURG 9.0 ORTHO LTXF (GLOVE) ×6 IMPLANT
GLOVE SURG SYN 6.5 ES PF (GLOVE) ×3 IMPLANT
GOWN STRL REUS TWL 2XL XL LVL4 (GOWN DISPOSABLE) ×3 IMPLANT
GOWN STRL REUS W/ TWL LRG LVL3 (GOWN DISPOSABLE) ×1 IMPLANT
GOWN STRL REUS W/TWL LRG LVL3 (GOWN DISPOSABLE) ×2
IV LACTATED RINGER IRRG 3000ML (IV SOLUTION) ×16
IV LR IRRIG 3000ML ARTHROMATIC (IV SOLUTION) ×8 IMPLANT
KIT RM TURNOVER STRD PROC AR (KITS) ×3 IMPLANT
MANIFOLD NEPTUNE II (INSTRUMENTS) ×3 IMPLANT
MAT BLUE FLOOR 46X72 FLO (MISCELLANEOUS) ×6 IMPLANT
NEEDLE HYPO 22GX1.5 SAFETY (NEEDLE) ×3 IMPLANT
PACK ARTHROSCOPY KNEE (MISCELLANEOUS) ×3 IMPLANT
PAD ABD DERMACEA PRESS 5X9 (GAUZE/BANDAGES/DRESSINGS) ×6 IMPLANT
PAD WRAPON POLAR KNEE (MISCELLANEOUS) ×1 IMPLANT
SET TUBE SUCT SHAVER OUTFL 24K (TUBING) ×3 IMPLANT
SET TUBE TIP INTRA-ARTICULAR (MISCELLANEOUS) ×3 IMPLANT
SOL PREP PVP 2OZ (MISCELLANEOUS)
SOLUTION PREP PVP 2OZ (MISCELLANEOUS) IMPLANT
STRIP CLOSURE SKIN 1/2X4 (GAUZE/BANDAGES/DRESSINGS) ×2 IMPLANT
SUT ETHILON 4-0 (SUTURE) ×2
SUT ETHILON 4-0 FS2 18XMFL BLK (SUTURE) ×1
SUTURE ETHLN 4-0 FS2 18XMF BLK (SUTURE) ×1 IMPLANT
SYR 20CC LL (SYRINGE) ×3 IMPLANT
TUBING ARTHRO INFLOW-ONLY STRL (TUBING) ×3 IMPLANT
WAND HAND CNTRL MULTIVAC 50 (MISCELLANEOUS) IMPLANT
WAND HAND CNTRL MULTIVAC 90 (MISCELLANEOUS) ×3 IMPLANT
WRAPON POLAR PAD KNEE (MISCELLANEOUS) ×3

## 2017-03-19 NOTE — Anesthesia Post-op Follow-up Note (Signed)
Anesthesia QCDR form completed.        

## 2017-03-19 NOTE — Progress Notes (Signed)
Heart rate range 44 to 60  Dr Kayleen Memos  Aware with no new orders  Pt stable

## 2017-03-19 NOTE — Discharge Instructions (Signed)

## 2017-03-19 NOTE — Progress Notes (Signed)
Polar care applied to left knee

## 2017-03-19 NOTE — Anesthesia Procedure Notes (Signed)
Procedure Name: LMA Insertion Date/Time: 03/19/2017 7:53 AM Performed by: Darlyne Russian Pre-anesthesia Checklist: Patient identified, Emergency Drugs available, Suction available, Patient being monitored and Timeout performed Patient Re-evaluated:Patient Re-evaluated prior to induction Oxygen Delivery Method: Circle system utilized Preoxygenation: Pre-oxygenation with 100% oxygen Induction Type: IV induction Ventilation: Mask ventilation without difficulty LMA: LMA inserted LMA Size: 4.0 Number of attempts: 1 Placement Confirmation: positive ETCO2 and breath sounds checked- equal and bilateral Tube secured with: Tape Dental Injury: Teeth and Oropharynx as per pre-operative assessment

## 2017-03-19 NOTE — Transfer of Care (Signed)
Immediate Anesthesia Transfer of Care Note  Patient: Charlotte Moses  Procedure(s) Performed: KNEE ARTHROSCOPY WITH MEDIAL MENISECTOMY, LIMITED SYNOVECTOMY, CHONDROPLASTY, EXCISION OF OSTEOPHYTES (Left )  Patient Location: PACU  Anesthesia Type:General  Level of Consciousness: drowsy and patient cooperative  Airway & Oxygen Therapy: Patient Spontanous Breathing and Patient connected to nasal cannula oxygen  Post-op Assessment: Report given to RN and Post -op Vital signs reviewed and stable  Post vital signs: Reviewed and stable  Last Vitals:  Vitals:   03/19/17 0607 03/19/17 0927  BP: 119/76 138/84  Pulse: (!) 53 74  Resp: 16 11  Temp: 36.6 C 36.8 C  SpO2: 98% 99%    Last Pain:  Vitals:   03/19/17 0927  TempSrc: Temporal  PainSc: 3          Complications: No apparent anesthesia complications

## 2017-03-19 NOTE — H&P (Signed)
The patient has been re-examined, and the chart reviewed, and there have been no interval changes to the documented history and physical.    The risks, benefits, and alternatives have been discussed at length, and the patient is willing to proceed.   

## 2017-03-19 NOTE — Anesthesia Preprocedure Evaluation (Signed)
Anesthesia Evaluation  Patient identified by MRN, date of birth, ID band Patient awake    Reviewed: Allergy & Precautions, H&P , NPO status , Patient's Chart, lab work & pertinent test results  Airway Mallampati: II  TM Distance: >3 FB Neck ROM: Full    Dental no notable dental hx.    Pulmonary asthma , former smoker,    Pulmonary exam normal breath sounds clear to auscultation       Cardiovascular negative cardio ROS Normal cardiovascular exam Rhythm:Regular Rate:Normal     Neuro/Psych negative neurological ROS  negative psych ROS   GI/Hepatic negative GI ROS, Neg liver ROS,   Endo/Other  negative endocrine ROSdiabetes, Type 2, Oral Hypoglycemic Agents  Renal/GU negative Renal ROS  negative genitourinary   Musculoskeletal negative musculoskeletal ROS (+) Arthritis , Osteoarthritis,    Abdominal   Peds negative pediatric ROS (+)  Hematology negative hematology ROS (+)   Anesthesia Other Findings   Reproductive/Obstetrics negative OB ROS                             Anesthesia Physical  Anesthesia Plan  ASA: III  Anesthesia Plan: General   Post-op Pain Management:    Induction: Intravenous  PONV Risk Score and Plan:   Airway Management Planned: LMA  Additional Equipment:   Intra-op Plan:   Post-operative Plan: Extubation in OR  Informed Consent: I have reviewed the patients History and Physical, chart, labs and discussed the procedure including the risks, benefits and alternatives for the proposed anesthesia with the patient or authorized representative who has indicated his/her understanding and acceptance.   Dental advisory given  Plan Discussed with: CRNA  Anesthesia Plan Comments:         Anesthesia Quick Evaluation

## 2017-03-19 NOTE — Op Note (Signed)
PATIENT:  Charlotte Moses  PRE-OPERATIVE DIAGNOSIS:  TEAR OF MEDIAL MENISCUS, LEFT KNEE  POST-OPERATIVE DIAGNOSIS:  Tear of the medial meniscus, diffuse chondrosis of the medial compartment and patellofemoral joint, large osteophyte off the medial femoral condyle, sprain of the anterior cruciate ligament, synovitis with large adhesion in the suprapatellar pouch  PROCEDURE:  LEFT KNEE ARTHROSCOPY WITH  Partial MEDIAL MENISECTOMY, synovectomy, chondroplasty of the medial femoral condyle and trochlea, removal of osteophyte from medial femoral condyle, lysis of adhesion  SURGEON:  Thornton Park, MD  ANESTHESIA:   General  PREOPERATIVE INDICATIONS:  Charlotte Moses  61 y.o. female with a diagnosis of TEAR OF MEDIAL MENISCUS, LEFT KNEE who failed conservative management and elected for surgical management.    The risks benefits and alternatives were discussed with the patient preoperatively including the risks of infection, bleeding, nerve injury, knee stiffness, persistent pain, osteoarthritis and the need for further surgery. Medical  risks include DVT and pulmonary embolism, myocardial infarction, stroke, pneumonia, respiratory failure and death. The patient understood these risks and wished to proceed.  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 The operative extremity was signed with the word yes and my initials according the hospital's correct site of surgery protocol.  The patient was brought to the operating room where they was placed supine on the operative table. General anesthesia was administered. The patient was prepped and draped in a sterile fashion.  A timeout was performed  to verify the patient's name, date of birth, medical record number, correct site of surgery correct procedure to be performed. It was also used to verify the patient received antibiotics that all appropriate instruments, and radiographic studies were available in the room. Once all in attendance were in agreement, the case began.  Proposed arthroscopy incisions were drawn out with a surgical marker. These were pre-injected with 1% lidocaine plain. An 11 blade was used to establish an inferior lateral and inferomedial portals. The inferomedial portal was created using a 18-gauge spinal needle under direct visualization.  A full diagnostic examination of the knee was performed including the suprapatellar pouch, patellofemoral joint, medial lateral compartments as well as the medial lateral gutters, the intercondylar notch in the posterior knee.  Patient had her unstable meniscal tear treated with a 4-0 resector shaver blade and straight duckbill basket. The meniscus was debrided until a stable rim was achieved. A probe was used to confirm stability. A chondroplasty of the medial femoral condyle, trochlea and undersurface of patella was also performed using a 4-0 resector shaver blade.   Shaver blade was also used to burr down a large osteophyte extending from medial femoral condyle into the intercondylar notch.  Patient was noted to have fibers of the anterior cruciate ligament with laxity consistent with a partial tear/sprain. There is no instability to the knee noted.  A partial synovectomy of the medial and lateral gutters, anterior knee and suprapatellar pouch was also performed using a 4-0 resector shaver blade and 90 ArthroCare wand.  A large adhesion was seen in the suprapatellar pouch and with this was excised using a 90 ArthroCare wand.  The knee was then copiously lavaged. All arthroscopic instruments were removed. The 2 arthroscopy portals were closed with 4-0 nylon. Steri-Strips were applied  along with  a dry sterile and compressive dressing. The patient was brought to the PACU in stable condition. I was scrubbed and present for the entire case and all sharp and instrument counts were correct at the conclusion the case. I spoke with the patient's neighbor postoperatively to let her know the case was performed without complication and the patient was stable in the recovery room.    Timoteo Gaul, MD

## 2017-03-20 ENCOUNTER — Encounter: Payer: Self-pay | Admitting: Orthopedic Surgery

## 2017-03-20 NOTE — Anesthesia Postprocedure Evaluation (Signed)
Anesthesia Post Note  Patient: Charlotte Moses  Procedure(s) Performed: KNEE ARTHROSCOPY WITH MEDIAL MENISECTOMY, LIMITED SYNOVECTOMY, CHONDROPLASTY, EXCISION OF OSTEOPHYTES (Left )  Patient location during evaluation: PACU Anesthesia Type: General Level of consciousness: awake and alert and oriented Pain management: pain level controlled Vital Signs Assessment: post-procedure vital signs reviewed and stable Respiratory status: spontaneous breathing Cardiovascular status: blood pressure returned to baseline Anesthetic complications: no     Last Vitals:  Vitals:   03/19/17 1015 03/19/17 1051  BP: 133/73 138/60  Pulse: (!) 45 (!) 45  Resp: 20 20  Temp: 37.3 C   SpO2: 99% 99%    Last Pain:  Vitals:   03/20/17 0842  TempSrc:   PainSc: 2                  Laterrance Nauta

## 2017-04-06 ENCOUNTER — Encounter (HOSPITAL_COMMUNITY): Payer: Self-pay

## 2017-04-30 ENCOUNTER — Telehealth (HOSPITAL_COMMUNITY): Payer: Self-pay

## 2017-04-30 NOTE — Telephone Encounter (Signed)
Declined appt/ achieved goals This patient is overdue for recommended follow-up with a bariatric surgeon at [facility]. A letter was mailed to the address on file 11.12.18 from both [place] in attempt to reestablish post-op care. The letter included a patient survey which was returned to the [facility] Bariatric Dept this week..  Patient declined an appointment advising she has met her goals & does not see need for regular visits. Comments include that she is very busy & travels out of town frequently with struggle to set routine appointments.  Copy of the survey was shared with Bariatric [facility] as well as Mallory Shirk at [facility] so she may file in patients office chart.

## 2017-05-08 ENCOUNTER — Encounter: Payer: Self-pay | Admitting: *Deleted

## 2017-05-08 ENCOUNTER — Other Ambulatory Visit: Payer: Self-pay

## 2017-05-12 NOTE — Discharge Instructions (Signed)

## 2017-05-13 ENCOUNTER — Ambulatory Visit: Payer: BC Managed Care – PPO | Admitting: Student in an Organized Health Care Education/Training Program

## 2017-05-13 ENCOUNTER — Ambulatory Visit
Admission: RE | Admit: 2017-05-13 | Discharge: 2017-05-13 | Disposition: A | Discharge status: Home / Self Care | Payer: BC Managed Care – PPO | Source: Ambulatory Visit | Attending: Ophthalmology | Admitting: Ophthalmology

## 2017-05-13 ENCOUNTER — Encounter
Admission: RE | Disposition: A | Discharge status: Home / Self Care | Payer: Self-pay | Source: Ambulatory Visit | Attending: Ophthalmology

## 2017-05-13 DIAGNOSIS — Z87891 Personal history of nicotine dependence: Secondary | ICD-10-CM | POA: Insufficient documentation

## 2017-05-13 DIAGNOSIS — H2511 Age-related nuclear cataract, right eye: Secondary | ICD-10-CM | POA: Diagnosis not present

## 2017-05-13 DIAGNOSIS — J45909 Unspecified asthma, uncomplicated: Secondary | ICD-10-CM | POA: Insufficient documentation

## 2017-05-13 DIAGNOSIS — Z9884 Bariatric surgery status: Secondary | ICD-10-CM | POA: Insufficient documentation

## 2017-05-13 HISTORY — PX: CATARACT EXTRACTION W/PHACO: SHX586

## 2017-05-13 SURGERY — PHACOEMULSIFICATION, CATARACT, WITH IOL INSERTION
Anesthesia: Monitor Anesthesia Care | Site: Eye | Laterality: Right | Wound class: Clean

## 2017-05-13 MED ORDER — EPINEPHRINE PF 1 MG/ML IJ SOLN
INTRAOCULAR | Status: DC | PRN
  Administered 2017-05-13: 54 mL via OPHTHALMIC

## 2017-05-13 MED ORDER — ARMC OPHTHALMIC DILATING DROPS
1.0000 "application " | OPHTHALMIC | Status: DC | PRN
  Administered 2017-05-13 (×3): 1 via OPHTHALMIC

## 2017-05-13 MED ORDER — BRIMONIDINE TARTRATE-TIMOLOL 0.2-0.5 % OP SOLN
OPHTHALMIC | Status: DC | PRN
Start: 2017-05-13 — End: 2017-05-13
  Administered 2017-05-13: 1 [drp] via OPHTHALMIC

## 2017-05-13 MED ORDER — CEFUROXIME OPHTHALMIC INJECTION 1 MG/0.1 ML
INJECTION | OPHTHALMIC | Status: DC | PRN
  Administered 2017-05-13: 0.1 mL via INTRACAMERAL

## 2017-05-13 MED ORDER — LIDOCAINE HCL (PF) 2 % IJ SOLN
INTRAOCULAR | Status: DC | PRN
  Administered 2017-05-13: 1 mL

## 2017-05-13 MED ORDER — MIDAZOLAM HCL 2 MG/2ML IJ SOLN
INTRAMUSCULAR | Status: DC | PRN
  Administered 2017-05-13: 2 mg via INTRAVENOUS

## 2017-05-13 MED ORDER — FENTANYL CITRATE (PF) 100 MCG/2ML IJ SOLN
INTRAMUSCULAR | Status: DC | PRN
  Administered 2017-05-13: 100 ug via INTRAVENOUS

## 2017-05-13 MED ORDER — MOXIFLOXACIN HCL 0.5 % OP SOLN
1.0000 [drp] | OPHTHALMIC | Status: DC | PRN
  Administered 2017-05-13 (×3): 1 [drp] via OPHTHALMIC

## 2017-05-13 MED ORDER — NA HYALUR & NA CHOND-NA HYALUR 0.4-0.35 ML IO KIT
PACK | INTRAOCULAR | Status: DC | PRN
  Administered 2017-05-13: 1 mL via INTRAOCULAR

## 2017-05-13 SURGICAL SUPPLY — 25 items
CANNULA ANT/CHMB 27GA (MISCELLANEOUS) ×2 IMPLANT
CARTRIDGE ABBOTT (MISCELLANEOUS) IMPLANT
GLOVE SURG LX 7.5 STRW (GLOVE) ×1
GLOVE SURG LX STRL 7.5 STRW (GLOVE) ×1 IMPLANT
GLOVE SURG TRIUMPH 8.0 PF LTX (GLOVE) ×2 IMPLANT
GOWN STRL REUS W/ TWL LRG LVL3 (GOWN DISPOSABLE) ×2 IMPLANT
GOWN STRL REUS W/TWL LRG LVL3 (GOWN DISPOSABLE) ×2
LENS IOL ACRYSOF IQ 20.0 (Intraocular Lens) ×2 IMPLANT
MARKER SKIN DUAL TIP RULER LAB (MISCELLANEOUS) ×2 IMPLANT
NDL RETROBULBAR .5 NSTRL (NEEDLE) IMPLANT
NEEDLE FILTER BLUNT 18X 1/2SAF (NEEDLE) ×1
NEEDLE FILTER BLUNT 18X1 1/2 (NEEDLE) ×1 IMPLANT
PACK CATARACT BRASINGTON (MISCELLANEOUS) ×2 IMPLANT
PACK EYE AFTER SURG (MISCELLANEOUS) ×2 IMPLANT
PACK OPTHALMIC (MISCELLANEOUS) ×2 IMPLANT
RING MALYGIN 7.0 (MISCELLANEOUS) IMPLANT
SUT ETHILON 10-0 CS-B-6CS-B-6 (SUTURE)
SUT VICRYL  9 0 (SUTURE)
SUT VICRYL 9 0 (SUTURE) IMPLANT
SUTURE EHLN 10-0 CS-B-6CS-B-6 (SUTURE) IMPLANT
SYR 3ML LL SCALE MARK (SYRINGE) ×2 IMPLANT
SYR 5ML LL (SYRINGE) ×2 IMPLANT
SYR TB 1ML LUER SLIP (SYRINGE) ×2 IMPLANT
WATER STERILE IRR 250ML POUR (IV SOLUTION) ×2 IMPLANT
WIPE NON LINTING 3.25X3.25 (MISCELLANEOUS) ×2 IMPLANT

## 2017-05-13 NOTE — Op Note (Signed)
LOCATION:  [facility]   PREOPERATIVE DIAGNOSIS:    Nuclear sclerotic cataract right eye. H25.11   POSTOPERATIVE DIAGNOSIS:  Nuclear sclerotic cataract right eye.     PROCEDURE:  Phacoemusification with posterior chamber intraocular lens placement of the right eye   LENS:   Implant Name Type Inv. Item Serial No. Manufacturer Lot No. LRB No. Used  LENS IOL ACRYSOF IQ 20.0 - [id number] Intraocular Lens LENS IOL ACRYSOF IQ 20.0 [id number] ALCON  Right 1        ULTRASOUND TIME: 21 % of 1 minutes, 7 seconds.  CDE 13.9   SURGEON:  Wyonia Hough, MD   ANESTHESIA:  Topical with tetracaine drops and 2% Xylocaine jelly, augmented with 1% preservative-free intracameral lidocaine.    COMPLICATIONS:  None.   DESCRIPTION OF PROCEDURE:  The patient was identified in the holding room and transported to the operating room and placed in the supine position under the operating microscope.  The right eye was identified as the operative eye and it was prepped and draped in the usual sterile ophthalmic fashion.   A 1 millimeter clear-corneal paracentesis was made at the 12:00 position.  0.5 ml of preservative-free 1% lidocaine was injected into the anterior chamber. The anterior chamber was filled with Viscoat viscoelastic.  A 2.4 millimeter keratome was used to make a near-clear corneal incision at the 9:00 position.  A curvilinear capsulorrhexis was made with a cystotome and capsulorrhexis forceps.  Balanced salt solution was used to hydrodissect and hydrodelineate the nucleus.   Phacoemulsification was then used in stop and chop fashion to remove the lens nucleus and epinucleus.  The remaining cortex was then removed using the irrigation and aspiration handpiece. Provisc was then placed into the capsular bag to distend it for lens placement.  A lens was then injected into the capsular bag.  The remaining viscoelastic was aspirated.   Wounds were hydrated with balanced salt solution.   The anterior chamber was inflated to a physiologic pressure with balanced salt solution.  No wound leaks were noted. Cefuroxime 0.1 ml of a 10mg /ml solution was injected into the anterior chamber for a dose of 1 mg of intracameral antibiotic at the completion of the case.   Timolol and Brimonidine drops were applied to the eye.  The patient was taken to the recovery room in stable condition without complications of anesthesia or surgery.   Braydyn Schultes 05/13/2017, 11:05 AM

## 2017-05-13 NOTE — Anesthesia Procedure Notes (Signed)
Procedure Name: MAC Date/Time: 05/13/2017 10:50 AM Performed by: Janna Arch, CRNA Pre-anesthesia Checklist: Patient identified, Emergency Drugs available, Suction available and Patient being monitored Patient Re-evaluated:Patient Re-evaluated prior to induction Oxygen Delivery Method: Nasal cannula

## 2017-05-13 NOTE — Anesthesia Preprocedure Evaluation (Signed)
Anesthesia Evaluation  Patient identified by MRN, date of birth, ID band Patient awake    Reviewed: Allergy & Precautions, H&P , NPO status , Patient's Chart, lab work & pertinent test results, reviewed documented beta blocker date and time   Airway Mallampati: II  TM Distance: >3 FB Neck ROM: full    Dental no notable dental hx.    Pulmonary asthma (altitude-induced) , former smoker,    Pulmonary exam normal breath sounds clear to auscultation       Cardiovascular Exercise Tolerance: Good negative cardio ROS   Rhythm:regular Rate:Normal     Neuro/Psych negative neurological ROS  negative psych ROS   GI/Hepatic negative GI ROS, Neg liver ROS,   Endo/Other  diabetes (resolved after bariatric surgery)  Renal/GU negative Renal ROS  negative genitourinary   Musculoskeletal  (+) Arthritis ,   Abdominal   Peds  Hematology negative hematology ROS (+)   Anesthesia Other Findings   Reproductive/Obstetrics negative OB ROS                             Anesthesia Physical Anesthesia Plan  ASA: II  Anesthesia Plan: MAC   Post-op Pain Management:    Induction:   PONV Risk Score and Plan:   Airway Management Planned:   Additional Equipment:   Intra-op Plan:   Post-operative Plan:   Informed Consent: I have reviewed the patients History and Physical, chart, labs and discussed the procedure including the risks, benefits and alternatives for the proposed anesthesia with the patient or authorized representative who has indicated his/her understanding and acceptance.   Dental Advisory Given  Plan Discussed with: CRNA  Anesthesia Plan Comments:         Anesthesia Quick Evaluation

## 2017-05-13 NOTE — H&P (Signed)
The History and Physical notes are on paper, have been signed, and are to be scanned. The patient remains stable and unchanged from the H&P.   Previous H&P reviewed, patient examined, and there are no changes.  Charlotte Moses 05/13/2017 9:37 AM

## 2017-05-13 NOTE — Anesthesia Postprocedure Evaluation (Signed)
Anesthesia Post Note  Patient: Charlotte Moses  Procedure(s) Performed: CATARACT EXTRACTION PHACO AND INTRAOCULAR LENS PLACEMENT (IOC)  RIGHT (Right Eye)  Patient location during evaluation: PACU Anesthesia Type: MAC Level of consciousness: awake and alert Pain management: pain level controlled Vital Signs Assessment: post-procedure vital signs reviewed and stable Respiratory status: spontaneous breathing, nonlabored ventilation, respiratory function stable and patient connected to nasal cannula oxygen Cardiovascular status: stable and blood pressure returned to baseline Postop Assessment: no apparent nausea or vomiting Anesthetic complications: no    Alisa Graff

## 2017-05-13 NOTE — Transfer of Care (Signed)
Immediate Anesthesia Transfer of Care Note  Patient: Charlotte Moses  Procedure(s) Performed: CATARACT EXTRACTION PHACO AND INTRAOCULAR LENS PLACEMENT (IOC)  RIGHT (Right Eye)  Patient Location: PACU  Anesthesia Type: MAC  Level of Consciousness: awake, alert  and patient cooperative  Airway and Oxygen Therapy: Patient Spontanous Breathing and Patient connected to supplemental oxygen  Post-op Assessment: Post-op Vital signs reviewed, Patient's Cardiovascular Status Stable, Respiratory Function Stable, Patent Airway and No signs of Nausea or vomiting  Post-op Vital Signs: Reviewed and stable  Complications: No apparent anesthesia complications

## 2017-05-15 ENCOUNTER — Encounter: Payer: Self-pay | Admitting: Ophthalmology

## 2017-06-08 ENCOUNTER — Ambulatory Visit: Payer: BC Managed Care – PPO | Admitting: Obstetrics and Gynecology

## 2017-06-29 ENCOUNTER — Ambulatory Visit: Payer: BC Managed Care – PPO | Admitting: Obstetrics and Gynecology

## 2017-07-08 ENCOUNTER — Ambulatory Visit: Payer: BC Managed Care – PPO | Admitting: Obstetrics and Gynecology

## 2017-07-15 ENCOUNTER — Ambulatory Visit (INDEPENDENT_AMBULATORY_CARE_PROVIDER_SITE_OTHER): Payer: BC Managed Care – PPO | Admitting: Obstetrics and Gynecology

## 2017-07-15 ENCOUNTER — Encounter: Payer: Self-pay | Admitting: Obstetrics and Gynecology

## 2017-07-15 DIAGNOSIS — Z1239 Encounter for other screening for malignant neoplasm of breast: Secondary | ICD-10-CM

## 2017-07-15 DIAGNOSIS — Z124 Encounter for screening for malignant neoplasm of cervix: Secondary | ICD-10-CM | POA: Diagnosis not present

## 2017-07-15 DIAGNOSIS — Z1231 Encounter for screening mammogram for malignant neoplasm of breast: Secondary | ICD-10-CM | POA: Diagnosis not present

## 2017-07-15 DIAGNOSIS — R87810 Cervical high risk human papillomavirus (HPV) DNA test positive: Secondary | ICD-10-CM | POA: Diagnosis not present

## 2017-07-15 DIAGNOSIS — Z01419 Encounter for gynecological examination (general) (routine) without abnormal findings: Secondary | ICD-10-CM | POA: Diagnosis not present

## 2017-07-15 DIAGNOSIS — R87610 Atypical squamous cells of undetermined significance on cytologic smear of cervix (ASC-US): Secondary | ICD-10-CM

## 2017-07-15 NOTE — Patient Instructions (Signed)
Preventive Care 40-64 Years, Female Preventive care refers to lifestyle choices and visits with your health care provider that can promote health and wellness. What does preventive care include?  A yearly physical exam. This is also called an annual well check.  Dental exams once or twice a year.  Routine eye exams. Ask your health care provider how often you should have your eyes checked.  Personal lifestyle choices, including: ? Daily care of your teeth and gums. ? Regular physical activity. ? Eating a healthy diet. ? Avoiding tobacco and drug use. ? Limiting alcohol use. ? Practicing safe sex. ? Taking low-dose aspirin daily starting at age 58. ? Taking vitamin and mineral supplements as recommended by your health care provider. What happens during an annual well check? The services and screenings done by your health care provider during your annual well check will depend on your age, overall health, lifestyle risk factors, and family history of disease. Counseling Your health care provider may ask you questions about your:  Alcohol use.  Tobacco use.  Drug use.  Emotional well-being.  Home and relationship well-being.  Sexual activity.  Eating habits.  Work and work Statistician.  Method of birth control.  Menstrual cycle.  Pregnancy history.  Screening You may have the following tests or measurements:  Height, weight, and BMI.  Blood pressure.  Lipid and cholesterol levels. These may be checked every 5 years, or more frequently if you are over 81 years old.  Skin check.  Lung cancer screening. You may have this screening every year starting at age 78 if you have a 30-pack-year history of smoking and currently smoke or have quit within the past 15 years.  Fecal occult blood test (FOBT) of the stool. You may have this test every year starting at age 65.  Flexible sigmoidoscopy or colonoscopy. You may have a sigmoidoscopy every 5 years or a colonoscopy  every 10 years starting at age 30.  Hepatitis C blood test.  Hepatitis B blood test.  Sexually transmitted disease (STD) testing.  Diabetes screening. This is done by checking your blood sugar (glucose) after you have not eaten for a while (fasting). You may have this done every 1-3 years.  Mammogram. This may be done every 1-2 years. Talk to your health care provider about when you should start having regular mammograms. This may depend on whether you have a family history of breast cancer.  BRCA-related cancer screening. This may be done if you have a family history of breast, ovarian, tubal, or peritoneal cancers.  Pelvic exam and Pap test. This may be done every 3 years starting at age 80. Starting at age 36, this may be done every 5 years if you have a Pap test in combination with an HPV test.  Bone density scan. This is done to screen for osteoporosis. You may have this scan if you are at high risk for osteoporosis.  Discuss your test results, treatment options, and if necessary, the need for more tests with your health care provider. Vaccines Your health care provider may recommend certain vaccines, such as:  Influenza vaccine. This is recommended every year.  Tetanus, diphtheria, and acellular pertussis (Tdap, Td) vaccine. You may need a Td booster every 10 years.  Varicella vaccine. You may need this if you have not been vaccinated.  Zoster vaccine. You may need this after age 5.  Measles, mumps, and rubella (MMR) vaccine. You may need at least one dose of MMR if you were born in  1958 or later. You may also need a second dose.  Pneumococcal 13-valent conjugate (PCV13) vaccine. You may need this if you have certain conditions and were not previously vaccinated.  Pneumococcal polysaccharide (PPSV23) vaccine. You may need one or two doses if you smoke cigarettes or if you have certain conditions.  Meningococcal vaccine. You may need this if you have certain  conditions.  Hepatitis A vaccine. You may need this if you have certain conditions or if you travel or work in places where you may be exposed to hepatitis A.  Hepatitis B vaccine. You may need this if you have certain conditions or if you travel or work in places where you may be exposed to hepatitis B.  Haemophilus influenzae type b (Hib) vaccine. You may need this if you have certain conditions.  Talk to your health care provider about which screenings and vaccines you need and how often you need them. This information is not intended to replace advice given to you by your health care provider. Make sure you discuss any questions you have with your health care provider. Document Released: 06/08/2015 Document Revised: 01/30/2016 Document Reviewed: 03/13/2015 Elsevier Interactive Patient Education  [employer].

## 2017-07-15 NOTE — Progress Notes (Signed)
Gynecology Annual Exam  PCP: Guadalupe Maple, MD  Chief Complaint:  Chief Complaint  Patient presents with  . Gynecologic Exam    History of Present Illness:Patient is a 62 y.o. No obstetric history on file. presents for annual exam. The patient has no complaints today.   LMP: No LMP recorded. Patient is postmenopausal. No vaginal bleeding, no vasomotor complaints  The patient is sexually active. She denies dyspareunia.  The patient does perform self breast exams.  There is no notable family history of breast or ovarian cancer in her family.  The patient wears seatbelts: yes.   The patient has regular exercise: not asked.    The patient reports current symptoms of depression.     Review of Systems: Review of Systems  Constitutional: Negative for chills and fever.  HENT: Negative for congestion.   Respiratory: Negative for cough and shortness of breath.   Cardiovascular: Negative for chest pain and palpitations.  Gastrointestinal: Negative for abdominal pain, constipation, diarrhea, heartburn, nausea and vomiting.  Genitourinary: Negative for dysuria, frequency and urgency.  Skin: Negative for itching and rash.  Neurological: Negative for dizziness and headaches.  Endo/Heme/Allergies: Negative for polydipsia.  Psychiatric/Behavioral: Negative for depression.    Past Medical History:  Past Medical History:  Diagnosis Date  . Arthritis    knees  . Asthma    altitude induced  . Diabetes mellitus without complication ([place])    PT HAD BARIATRIC SURGERY AND DIABETES NOW RESOLVED  . Hyperlipidemia   . Obesity   . Personal history of colonic polyps     Past Surgical History:  Past Surgical History:  Procedure Laterality Date  . CATARACT EXTRACTION W/PHACO Left 11/19/2016   Procedure: CATARACT EXTRACTION PHACO AND INTRAOCULAR LENS PLACEMENT (IOC);  Surgeon: Estill Cotta, MD;  Location: [facility] ORS;  Service: Ophthalmology;  Laterality: Left;  [place] 00:57 AP% 23.8 CDE  28.01 Fluid pack lot # [id number]  . CATARACT EXTRACTION W/PHACO Right 05/13/2017   Procedure: CATARACT EXTRACTION PHACO AND INTRAOCULAR LENS PLACEMENT ([place])  RIGHT;  Surgeon: Leandrew Koyanagi, MD;  Location: [place];  Service: Ophthalmology;  Laterality: Right;  . COLONOSCOPY N/A 2008, 2011  . COLONOSCOPY WITH PROPOFOL N/A 05/30/2015   Procedure: COLONOSCOPY WITH PROPOFOL;  Surgeon: Robert Bellow, MD;  [facility] END[facility]COPY;  Service: Endoscopy;  Laterality: N/A;  . EYE SURGERY Bilateral ~10 years ago   corrective  . HEMORROIDECTOMY  07/30/2006   External hemorrhoid removed.  Marland Kitchen KNEE ARTHROSCOPY WITH MEDIAL MENISECTOMY Left 03/19/2017   Procedure: KNEE ARTHROSCOPY WITH MEDIAL MENISECTOMY, LIMITED SYNOVECTOMY, CHONDROPLASTY, EXCISION OF OSTEOPHYTES;  Surgeon: Thornton Park, MD;  Location: [facility] ORS;  Service: Orthopedics;  Laterality: Left;  . KNEE SURGERY Left ~25 years ago  . LAPAROSCOPIC GASTRIC SLEEVE RESECTION N/A 09/05/2013   Procedure: LAPAROSCOPIC GASTRIC SLEEVE RESECTION and REPAIR OF HIATAL HERNIA, upper endoscopy;  Surgeon: Gayland Curry, MD;  Location: [facility];  Service: General;  Laterality: N/A;  . POLYPECTOMY  2008   benign  . VEIN SURGERY Right more than 10 years ago   varicose veins    Gynecologic History:  No LMP recorded. Patient is postmenopausal. Last Pap: Results were: 06/04/2016 ASCUS with POSITIVE high risk HPV with negative colposcopy 07/17/2016 Last mammogram: 05/22/2015 Results were: BI-RAD II  Obstetric History: No obstetric history on file.  Family History:  Family History  Problem Relation Age of Onset  . Arthritis Father   . Hyperlipidemia Other   . Hypertension Other   .  Obesity Other   . Ulcers Other     Social History:  Social History   Socioeconomic History  . Marital status: Single    Spouse name: Not on file  . Number of children: Not on file  . Years of education: Not on file  . Highest education level: Not on file    Social Needs  . Financial resource strain: Not on file  . Food insecurity - worry: Not on file  . Food insecurity - inability: Not on file  . Transportation needs - medical: Not on file  . Transportation needs - non-medical: Not on file  Occupational History  . Not on file  Tobacco Use  . Smoking status: Former Smoker    Packs/day: 1.00    Years: 20.00    Pack years: 20.00    Types: Cigarettes    Last attempt to quit: 05/26/1988    Years since quitting: 29.1  . Smokeless tobacco: Never Used  Substance and Sexual Activity  . Alcohol use: Yes    Comment: occasional  . Drug use: No  . Sexual activity: Yes    Birth control/protection: None  Other Topics Concern  . Not on file  Social History Narrative  . Not on file    Allergies:  Allergies  Allergen Reactions  . Latex Itching    LATEX RAST NEGATIVE 1 WEEK AGO PATIENT STATES  . Adhesive [Tape] Rash  . Codeine Rash    Medications: Prior to Admission medications   Medication Sig Start Date End Date Taking? Authorizing Provider  aspirin EC 325 MG tablet Take 1 tablet (325 mg total) by mouth daily. 03/19/17   Thornton Park, MD  Biotin w/ Vitamins C & E (HAIR/SKIN/NAILS PO) Take 1 tablet by mouth daily.    [provider]  Calcium Citrate-Vitamin D (CALCIUM + D PO) Take 1 tablet by mouth daily.    [provider]  Cyanocobalamin (B-12 SL) Place 1 tablet under the tongue daily.    [provider]  HYDROcodone-acetaminophen (NORCO) 5-325 MG tablet Take 1 tablet by mouth every 6 (six) hours as needed for moderate pain. MAXIMUM TOTAL ACETAMINOPHEN DOSE IS 4000 MG PER DAY 03/19/17   Thornton Park, MD  Multiple Vitamin (MULTIVITAMIN WITH MINERALS) TABS tablet Take 1 tablet by mouth daily.    [provider]  Multiple Vitamins-Minerals (OCUVITE PO) Take 1 tablet by mouth daily.    [provider]  naproxen sodium (ANAPROX) 220 MG tablet Take 220-440 mg by mouth 2 (two) times daily as  needed (pain).     [provider]  ondansetron (ZOFRAN) 4 MG tablet Take 1 tablet (4 mg total) by mouth every 8 (eight) hours as needed for nausea or vomiting. 03/19/17   Thornton Park, MD    Physical Exam Blood pressure 130/70, pulse 60, height 5\' 4"  (1.626 m), weight 201 lb (91.2 kg). Body mass index is 34.5 kg/m.   General: NAD HEENT: normocephalic, anicteric Thyroid: no enlargement, no palpable nodules Pulmonary: No increased work of breathing, CTAB Cardiovascular: RRR, distal pulses 2+ Breast: Breast symmetrical, no tenderness, no palpable nodules or masses, no skin or nipple retraction present, no nipple discharge.  No axillary or supraclavicular lymphadenopathy. Abdomen: NABS, soft, non-tender, non-distended.  Umbilicus without lesions.  No hepatomegaly, splenomegaly or masses palpable. No evidence of hernia  Genitourinary:  External: Normal external female genitalia.  Normal urethral meatus, normal  Bartholin's and Skene's glands.    Vagina: Normal vaginal mucosa, no evidence of prolapse.  Cervix: Grossly normal in appearance, no bleeding  Uterus: Non-enlarged, mobile, normal contour.  No CMT  Adnexa: ovaries non-enlarged, no adnexal masses  Rectal: deferred  Lymphatic: no evidence of inguinal lymphadenopathy Extremities: no edema, erythema, or tenderness Neurologic: Grossly intact Psychiatric: mood appropriate, affect full  Female chaperone present for pelvic and breast  portions of the physical exam     Assessment: 62 y.o. No obstetric history on file. routine annual exam  Plan: Problem List Items Addressed This Visit    None    Visit Diagnoses    Screening for malignant neoplasm of cervix       Relevant Orders   PapIG, HPV, rfx 16/18   Breast screening       Relevant Orders   MM DIGITAL SCREENING BILATERAL   Encounter for gynecological examination without abnormal finding       ASCUS with positive high risk HPV cervical          1)  Mammogram - recommend yearly screening mammogram.  Mammogram Was ordered today  2) STI screening was not offered  3) ASCCP guidelines and rational discussed.  Patient opts for yearly screening interval - follow up ASCUS HPV positive pap last year  4) Osteoporosis  - per USPTF routine screening DEXA at age 63 - FRAX 51 year major fracture risk 6.5%,  10 year hip fracture risk 0.5%  Consider FDA-approved medical therapies in postmenopausal women and men aged 71 years and older, based on the following: a) A hip or vertebral (clinical or morphometric) fracture b) T-score ? -2.5 at the femoral neck or spine after appropriate evaluation to exclude secondary causes C) Low bone mass (T-score between -1.0 and -2.5 at the femoral neck or spine) and a 10-year probability of a hip fracture ? 3% or a 10-year probability of a major osteoporosis-related fracture ? 20% based on the [place]-adapted WHO algorithm   5) Routine healthcare maintenance including cholesterol, diabetes screening discussed managed by PCP  6) Colonoscopy 9/9/2011and 05/30/2015 Dr. Hervey Ard  7) Return in 1 year (on 07/15/2018) for annual exam.    [name], MD [facility], [facility] 07/15/2017, 2:57 PM

## 2017-07-20 LAB — PAPIG, HPV, RFX 16/18
HPV, high-risk: POSITIVE — AB
PAP Smear Comment: 0

## 2017-07-30 ENCOUNTER — Ambulatory Visit
Admission: RE | Admit: 2017-07-30 | Discharge: 2017-07-30 | Disposition: A | Discharge status: Home / Self Care | Payer: BC Managed Care – PPO | Source: Ambulatory Visit | Attending: Obstetrics and Gynecology | Admitting: Obstetrics and Gynecology

## 2017-07-30 DIAGNOSIS — Z1231 Encounter for screening mammogram for malignant neoplasm of breast: Secondary | ICD-10-CM | POA: Diagnosis not present

## 2017-07-30 DIAGNOSIS — Z1239 Encounter for other screening for malignant neoplasm of breast: Secondary | ICD-10-CM

## 2017-08-05 ENCOUNTER — Inpatient Hospital Stay
Admission: RE | Admit: 2017-08-05 | Discharge: 2017-08-05 | Disposition: A | Discharge status: Home / Self Care | Payer: Self-pay | Source: Ambulatory Visit | Attending: *Deleted | Admitting: *Deleted

## 2017-08-05 ENCOUNTER — Other Ambulatory Visit: Payer: Self-pay | Admitting: *Deleted

## 2017-08-05 DIAGNOSIS — Z9289 Personal history of other medical treatment: Secondary | ICD-10-CM

## 2017-08-13 ENCOUNTER — Encounter: Payer: Self-pay | Admitting: Obstetrics and Gynecology

## 2017-08-13 ENCOUNTER — Ambulatory Visit (INDEPENDENT_AMBULATORY_CARE_PROVIDER_SITE_OTHER): Payer: BC Managed Care – PPO | Admitting: Obstetrics and Gynecology

## 2017-08-13 VITALS — BP 122/72 | HR 70 | Wt 200.0 lb

## 2017-08-13 DIAGNOSIS — R87610 Atypical squamous cells of undetermined significance on cytologic smear of cervix (ASC-US): Secondary | ICD-10-CM | POA: Diagnosis not present

## 2017-08-13 DIAGNOSIS — R87810 Cervical high risk human papillomavirus (HPV) DNA test positive: Secondary | ICD-10-CM | POA: Diagnosis not present

## 2017-08-13 NOTE — Progress Notes (Signed)
   GYNECOLOGY CLINIC COLPOSCOPY PROCEDURE NOTE  62 y.o. G1P0010 here for colposcopy for ASCUS with POSITIVE high risk HPV  pap smear on 07/15/2017. Discussed underlying role for HPV infection in the development of cervical dysplasia, its natural history and progression/regression, need for surveillance.  Is the patient  pregnant: No LMP: No LMP recorded. Patient is postmenopausal. Smoking status:  reports that she quit smoking about 29 years ago. Her smoking use included cigarettes. She has a 20.00 pack-year smoking history. She has never used smokeless tobacco. Contraception: post menopausal status High risk partner:No History of STD:  No Future fertility desired:  No  Patient given informed consent, signed copy in the chart, time out was performed.  The patient was position in dorsal lithotomy position. Speculum was placed the cervix was visualized.   After application of acetic acid colposcopic inspection of the cervix was undertaken.   Colposcopy adequate, full visualization of transformation zone: Yes no visible lesions;random 12 O'Clock biopsy obtained.   ECC specimen obtained:  Yes  All specimens were labeled and sent to pathology.   Patient was given post procedure instructions.  Will follow up pathology and manage accordingly.  Routine preventative health maintenance measures emphasized.  OBGyn Exam  [name], MD, [facility], [facility]

## 2017-08-13 NOTE — Addendum Note (Signed)
Addended by: Dorthula Nettles on: 08/13/2017 03:14 PM   Modules accepted: Orders

## 2017-08-18 LAB — PATHOLOGY

## 2018-07-23 ENCOUNTER — Encounter: Payer: Self-pay | Admitting: Obstetrics and Gynecology

## 2018-07-23 ENCOUNTER — Ambulatory Visit (INDEPENDENT_AMBULATORY_CARE_PROVIDER_SITE_OTHER): Payer: BC Managed Care – PPO | Admitting: Obstetrics and Gynecology

## 2018-07-23 ENCOUNTER — Other Ambulatory Visit (HOSPITAL_COMMUNITY)
Admission: RE | Admit: 2018-07-23 | Discharge: 2018-07-23 | Disposition: A | Discharge status: Home / Self Care | Payer: BC Managed Care – PPO | Source: Ambulatory Visit | Attending: Obstetrics and Gynecology | Admitting: Obstetrics and Gynecology

## 2018-07-23 VITALS — BP 122/56 | HR 57 | Wt 215.0 lb

## 2018-07-23 DIAGNOSIS — R87612 Low grade squamous intraepithelial lesion on cytologic smear of cervix (LGSIL): Secondary | ICD-10-CM | POA: Insufficient documentation

## 2018-07-23 DIAGNOSIS — Z01419 Encounter for gynecological examination (general) (routine) without abnormal findings: Secondary | ICD-10-CM | POA: Diagnosis not present

## 2018-07-23 DIAGNOSIS — Z1151 Encounter for screening for human papillomavirus (HPV): Secondary | ICD-10-CM | POA: Insufficient documentation

## 2018-07-23 DIAGNOSIS — Z124 Encounter for screening for malignant neoplasm of cervix: Secondary | ICD-10-CM

## 2018-07-23 DIAGNOSIS — E669 Obesity, unspecified: Secondary | ICD-10-CM

## 2018-07-23 DIAGNOSIS — Z6836 Body mass index (BMI) 36.0-36.9, adult: Secondary | ICD-10-CM

## 2018-07-23 DIAGNOSIS — Z1239 Encounter for other screening for malignant neoplasm of breast: Secondary | ICD-10-CM

## 2018-07-23 NOTE — Progress Notes (Addendum)
Gynecology Annual Exam  PCP: Guadalupe Maple, MD  Chief Complaint:  Chief Complaint  Patient presents with  . Gynecologic Exam    History of Present Illness:Patient is a 63 y.o. G1P0010 presents for annual exam. The patient has no complaints today.   LMP: No LMP recorded. Patient is postmenopausal. No PMB  There is no notable family history of breast or ovarian cancer in her family.  The patient wears seatbelts: yes.   The patient has regular exercise: not asked.    The patient denies current symptoms of depression.     Review of Systems: Review of Systems  Constitutional: Negative for chills and fever.  HENT: Negative for congestion.   Respiratory: Negative for cough and shortness of breath.   Cardiovascular: Negative for chest pain and palpitations.  Gastrointestinal: Negative for abdominal pain, constipation, diarrhea, heartburn, nausea and vomiting.  Genitourinary: Negative for dysuria, frequency and urgency.  Skin: Negative for itching and rash.  Neurological: Negative for dizziness and headaches.  Endo/Heme/Allergies: Negative for polydipsia.  Psychiatric/Behavioral: Negative for depression.    Past Medical History:  Past Medical History:  Diagnosis Date  . Arthritis    knees  . Asthma    altitude induced  . Diabetes mellitus without complication ([place])    PT HAD BARIATRIC SURGERY AND DIABETES NOW RESOLVED  . Hyperlipidemia   . Obesity   . Personal history of colonic polyps     Past Surgical History:  Past Surgical History:  Procedure Laterality Date  . BREAST EXCISIONAL BIOPSY Left 1990   benign  . CATARACT EXTRACTION W/PHACO Left 11/19/2016   Procedure: CATARACT EXTRACTION PHACO AND INTRAOCULAR LENS PLACEMENT (IOC);  Surgeon: Estill Cotta, MD;  Location: [facility] ORS;  Service: Ophthalmology;  Laterality: Left;  [place] 00:57 AP% 23.8 CDE 28.01 Fluid pack lot # [id number]  . CATARACT EXTRACTION W/PHACO Right 05/13/2017   Procedure: CATARACT  EXTRACTION PHACO AND INTRAOCULAR LENS PLACEMENT ([place])  RIGHT;  Surgeon: Leandrew Koyanagi, MD;  Location: [place];  Service: Ophthalmology;  Laterality: Right;  . COLONOSCOPY N/A 2008, 2011  . COLONOSCOPY WITH PROPOFOL N/A 05/30/2015   Procedure: COLONOSCOPY WITH PROPOFOL;  Surgeon: Robert Bellow, MD;  Location: [facility] ENDOSCOPY;  Service: Endoscopy;  Laterality: N/A;  . EYE SURGERY Bilateral ~10 years ago   corrective  . HEMORROIDECTOMY  07/30/2006   External hemorrhoid removed.  Marland Kitchen KNEE ARTHROSCOPY WITH MEDIAL MENISECTOMY Left 03/19/2017   Procedure: KNEE ARTHROSCOPY WITH MEDIAL MENISECTOMY, LIMITED SYNOVECTOMY, CHONDROPLASTY, EXCISION OF OSTEOPHYTES;  Surgeon: Thornton Park, MD;  Location: [facility] ORS;  Service: Orthopedics;  Laterality: Left;  . KNEE SURGERY Left ~25 years ago  . LAPAROSCOPIC GASTRIC SLEEVE RESECTION N/A 09/05/2013   Procedure: LAPAROSCOPIC GASTRIC SLEEVE RESECTION and REPAIR OF HIATAL HERNIA, upper endoscopy;  Surgeon: Gayland Curry, MD;  Location: [facility];  Service: General;  Laterality: N/A;  . LIPOSUCTION    . POLYPECTOMY  2008   benign  . VEIN SURGERY Right more than 10 years ago   varicose veins    Gynecologic History:  No LMP recorded. Patient is postmenopausal. Last Pap: Results were: 07/15/2017 ASCUS with POSITIVE high risk HPV  Colposcopy 08/13/2017 negative Last mammogram: 07/30/2017 Results were: BI-RAD I  Obstetric History: G1P0010  Family History:  Family History  Problem Relation Age of Onset  . Lymphoma Mother   . Arthritis Father   . Hyperlipidemia Other   . Hypertension Other   . Obesity Other   . Ulcers Other   .  Pancreatic cancer Paternal Grandfather 60  . Breast cancer Neg Hx     Social History:  Social History   Socioeconomic History  . Marital status: Single    Spouse name: Not on file  . Number of children: Not on file  . Years of education: Not on file  . Highest education level: Not on file  Occupational History    . Not on file  Social Needs  . Financial resource strain: Not on file  . Food insecurity:    Worry: Not on file    Inability: Not on file  . Transportation needs:    Medical: Not on file    Non-medical: Not on file  Tobacco Use  . Smoking status: Former Smoker    Packs/day: 1.00    Years: 20.00    Pack years: 20.00    Types: Cigarettes    Last attempt to quit: 05/26/1988    Years since quitting: 30.1  . Smokeless tobacco: Never Used  Substance and Sexual Activity  . Alcohol use: Yes    Comment: occasional  . Drug use: No  . Sexual activity: Yes    Birth control/protection: None  Lifestyle  . Physical activity:    Days per week: 5 days    Minutes per session: 30 min  . Stress: Not at all  Relationships  . Social connections:    Talks on phone: More than three times a week    Gets together: More than three times a week    Attends religious service: 1 to 4 times per year    Active member of club or organization: No    Attends meetings of clubs or organizations: Never    Relationship status: Never married  . Intimate partner violence:    Fear of current or ex partner: No    Emotionally abused: No    Physically abused: No    Forced sexual activity: No  Other Topics Concern  . Not on file  Social History Narrative  . Not on file    Allergies:  Allergies  Allergen Reactions  . Latex Itching    LATEX RAST NEGATIVE 1 WEEK AGO PATIENT STATES  . Adhesive [Tape] Rash  . Codeine Rash    Medications: Prior to Admission medications   Medication Sig Start Date End Date Taking? Authorizing Provider  Biotin w/ Vitamins C & E (HAIR/SKIN/NAILS PO) Take 1 tablet by mouth daily.   Yes [provider]  Calcium Citrate-Vitamin D (CALCIUM + D PO) Take 1 tablet by mouth daily.   Yes [provider]  Cyanocobalamin (B-12 SL) Place 1 tablet under the tongue daily.   Yes [provider]  Multiple Vitamin (MULTIVITAMIN WITH MINERALS) TABS tablet Take 1  tablet by mouth daily.   Yes [provider]  aspirin EC 325 MG tablet Take 1 tablet (325 mg total) by mouth daily. Patient not taking: Reported on 07/23/2018 03/19/17   Thornton Park, MD  naproxen sodium (ANAPROX) 220 MG tablet Take 220-440 mg by mouth 2 (two) times daily as needed (pain).     [provider]    Physical Exam Vitals: Blood pressure (!) 122/56, pulse (!) 57, weight 215 lb (97.5 kg). Body mass index is 36.9 kg/m.  General: NAD HEENT: normocephalic, anicteric Thyroid: no enlargement, no palpable nodules Pulmonary: No increased work of breathing, CTAB Cardiovascular: RRR, distal pulses 2+ Breast: Breast symmetrical, no tenderness, no palpable nodules or masses, no skin or nipple retraction present, no nipple discharge.  No axillary or supraclavicular lymphadenopathy. Abdomen: NABS, soft, non-tender, non-distended.  Umbilicus without lesions.  No hepatomegaly, splenomegaly or masses palpable. No evidence of hernia  Genitourinary:  External: Normal external female genitalia.  Normal urethral meatus, normal Bartholin's and Skene's glands.    Vagina: Normal vaginal mucosa, no evidence of prolapse.    Cervix: Grossly normal in appearance, no bleeding  Uterus: Non-enlarged, mobile, normal contour.  No CMT  Adnexa: ovaries non-enlarged, no adnexal masses  Rectal: deferred  Lymphatic: no evidence of inguinal lymphadenopathy Extremities: no edema, erythema, or tenderness Neurologic: Grossly intact Psychiatric: mood appropriate, affect full  Female chaperone present for pelvic and breast  portions of the physical exam       Assessment: 63 y.o. G1P0010 routine annual exam  Plan: Problem List Items Addressed This Visit    None    Visit Diagnoses    Encounter for gynecological examination without abnormal finding    -  Primary   Screening for malignant neoplasm of cervix       Relevant Orders   Cytology - PAP   Breast screening       Relevant  Orders   MM 3D SCREEN BREAST BILATERAL      1) Mammogram - recommend yearly screening mammogram.  Mammogram Was ordered today  2) STI screening  was notoffered and therefore not obtained  3) ASCCP guidelines and rational discussed. Repeat obtained today  4) Osteoporosis  - per USPTF routine screening DEXA at age 78  5) Routine healthcare maintenance including cholesterol, diabetes screening discussed managed by PCP  6) Colonoscopy - 02/01/2010 and 1/42017 Dr. Bary Castilla  7) Obesity - trial of phentermine Rx sent follow up 4 weeks  8) Return in about 1 year (around 07/24/2019) for annual.    [name], MD [facility], [facility] 07/23/2018, 1:33 PM

## 2018-07-26 ENCOUNTER — Telehealth: Payer: Self-pay

## 2018-07-26 MED ORDER — PHENTERMINE HCL 37.5 MG PO TABS: 37.5000 mg | ORAL_TABLET | Freq: Every day | ORAL | 0 refills | Status: DC

## 2018-07-26 NOTE — Telephone Encounter (Signed)
Advise

## 2018-07-26 NOTE — Telephone Encounter (Signed)
Pt aware, via voicemail, Clarise Cruz of Rx being sent to pharmacy. I advised her of the office calling her for 4 week follow up.

## 2018-07-26 NOTE — Telephone Encounter (Signed)
Pt was seen Friday.  When she left she was under the impression the [facility] was going to call in rx for diet pill.  [facility] doesn't have rx.  [phone]

## 2018-07-26 NOTE — Addendum Note (Signed)
Addended by: Dorthula Nettles on: 07/26/2018 01:35 PM   Modules accepted: Orders

## 2018-07-26 NOTE — Telephone Encounter (Signed)
Sent needs follow up in 4 weeks

## 2018-07-27 NOTE — Telephone Encounter (Signed)
Patient is schedule 08/24/18

## 2018-07-29 LAB — CYTOLOGY - PAP
ADEQUACY: ABSENT — AB
HPV 16/18/45 genotyping: POSITIVE — AB
HPV: DETECTED — AB

## 2018-08-04 ENCOUNTER — Telehealth: Payer: Self-pay | Admitting: Obstetrics and Gynecology

## 2018-08-04 NOTE — Telephone Encounter (Signed)
-----   Message from [name], MD sent at 08/04/2018  1:05 PM EDT ----- Regarding: Colposcopy Colposcopy in 2-6 weeks

## 2018-08-04 NOTE — Telephone Encounter (Signed)
Patient has been schedule for Thursday 08/19/18 for Colpo

## 2018-08-19 ENCOUNTER — Other Ambulatory Visit (HOSPITAL_COMMUNITY)
Admission: RE | Admit: 2018-08-19 | Discharge: 2018-08-19 | Disposition: A | Discharge status: Home / Self Care | Payer: BC Managed Care – PPO | Source: Ambulatory Visit | Attending: Obstetrics and Gynecology | Admitting: Obstetrics and Gynecology

## 2018-08-19 ENCOUNTER — Other Ambulatory Visit: Payer: Self-pay

## 2018-08-19 ENCOUNTER — Ambulatory Visit (INDEPENDENT_AMBULATORY_CARE_PROVIDER_SITE_OTHER): Payer: BC Managed Care – PPO | Admitting: Obstetrics and Gynecology

## 2018-08-19 VITALS — BP 132/88 | HR 70 | Ht 64.0 in | Wt 206.0 lb

## 2018-08-19 DIAGNOSIS — Z6835 Body mass index (BMI) 35.0-35.9, adult: Secondary | ICD-10-CM | POA: Diagnosis not present

## 2018-08-19 DIAGNOSIS — B977 Papillomavirus as the cause of diseases classified elsewhere: Secondary | ICD-10-CM

## 2018-08-19 DIAGNOSIS — R87619 Unspecified abnormal cytological findings in specimens from cervix uteri: Secondary | ICD-10-CM

## 2018-08-19 DIAGNOSIS — N72 Inflammatory disease of cervix uteri: Secondary | ICD-10-CM | POA: Diagnosis not present

## 2018-08-19 DIAGNOSIS — E669 Obesity, unspecified: Secondary | ICD-10-CM

## 2018-08-19 DIAGNOSIS — R87612 Low grade squamous intraepithelial lesion on cytologic smear of cervix (LGSIL): Secondary | ICD-10-CM | POA: Diagnosis not present

## 2018-08-19 NOTE — Progress Notes (Signed)
Gynecology Office Visit  Chief Complaint:  Chief Complaint  Patient presents with  . Colposcopy  . Follow-up    Weight loss check    History of Present Illness: Patientis a 63 y.o. G23P0010 female, who presents for the evaluation of the desire to lose weight. She has lost 9 pounds 1 months. The patient states the following symptoms since starting her weight loss therapy: appetite suppression, energy, and weight loss.  The patient also reports no other ill effects. The patient specifically denies heart palpitations, anxiety, and insomnia.    Review of Systems: 10 point review of systems negative unless otherwise noted in HPI  Past Medical History:  Past Medical History:  Diagnosis Date  . Arthritis    knees  . Asthma    altitude induced  . Diabetes mellitus without complication ([place])    PT HAD BARIATRIC SURGERY AND DIABETES NOW RESOLVED  . Hyperlipidemia   . Obesity   . Personal history of colonic polyps     Past Surgical History:  Past Surgical History:  Procedure Laterality Date  . BREAST EXCISIONAL BIOPSY Left 1990   benign  . CATARACT EXTRACTION W/PHACO Left 11/19/2016   Procedure: CATARACT EXTRACTION PHACO AND INTRAOCULAR LENS PLACEMENT (IOC);  Surgeon: Estill Cotta, MD;  Location: [facility] ORS;  Service: Ophthalmology;  Laterality: Left;  [place] 00:57 AP% 23.8 CDE 28.01 Fluid pack lot # [id number]  . CATARACT EXTRACTION W/PHACO Right 05/13/2017   Procedure: CATARACT EXTRACTION PHACO AND INTRAOCULAR LENS PLACEMENT ([place])  RIGHT;  Surgeon: Leandrew Koyanagi, MD;  Location: [place];  Service: Ophthalmology;  Laterality: Right;  . COLONOSCOPY N/A 2008, 2011  . COLONOSCOPY WITH PROPOFOL N/A 05/30/2015   Procedure: COLONOSCOPY WITH PROPOFOL;  Surgeon: Robert Bellow, MD;  Location: [facility] ENDOSCOPY;  Service: Endoscopy;  Laterality: N/A;  . EYE SURGERY Bilateral ~10 years ago   corrective  . HEMORROIDECTOMY  07/30/2006   External hemorrhoid removed.  Marland Kitchen  KNEE ARTHROSCOPY WITH MEDIAL MENISECTOMY Left 03/19/2017   Procedure: KNEE ARTHROSCOPY WITH MEDIAL MENISECTOMY, LIMITED SYNOVECTOMY, CHONDROPLASTY, EXCISION OF OSTEOPHYTES;  Surgeon: Thornton Park, MD;  Location: [facility] ORS;  Service: Orthopedics;  Laterality: Left;  . KNEE SURGERY Left ~25 years ago  . LAPAROSCOPIC GASTRIC SLEEVE RESECTION N/A 09/05/2013   Procedure: LAPAROSCOPIC GASTRIC SLEEVE RESECTION and REPAIR OF HIATAL HERNIA, upper endoscopy;  Surgeon: Gayland Curry, MD;  Location: [facility];  Service: General;  Laterality: N/A;  . LIPOSUCTION    . POLYPECTOMY  2008   benign  . VEIN SURGERY Right more than 10 years ago   varicose veins    Gynecologic History: No LMP recorded. Patient is postmenopausal.  Obstetric History: G1P0010  Family History:  Family History  Problem Relation Age of Onset  . Lymphoma Mother   . Arthritis Father   . Hyperlipidemia Other   . Hypertension Other   . Obesity Other   . Ulcers Other   . Pancreatic cancer Paternal Grandfather 11  . Breast cancer Neg Hx     Social History:  Social History   Socioeconomic History  . Marital status: Single    Spouse name: Not on file  . Number of children: Not on file  . Years of education: Not on file  . Highest education level: Not on file  Occupational History  . Not on file  Social Needs  . Financial resource strain: Not on file  . Food insecurity:    Worry: Not on file    Inability: Not  on file  . Transportation needs:    Medical: Not on file    Non-medical: Not on file  Tobacco Use  . Smoking status: Former Smoker    Packs/day: 1.00    Years: 20.00    Pack years: 20.00    Types: Cigarettes    Last attempt to quit: 05/26/1988    Years since quitting: 30.2  . Smokeless tobacco: Never Used  Substance and Sexual Activity  . Alcohol use: Yes    Comment: occasional  . Drug use: No  . Sexual activity: Yes    Birth control/protection: None  Lifestyle  . Physical activity:    Days per week: 5  days    Minutes per session: 30 min  . Stress: Not at all  Relationships  . Social connections:    Talks on phone: More than three times a week    Gets together: More than three times a week    Attends religious service: 1 to 4 times per year    Active member of club or organization: No    Attends meetings of clubs or organizations: Never    Relationship status: Never married  . Intimate partner violence:    Fear of current or ex partner: No    Emotionally abused: No    Physically abused: No    Forced sexual activity: No  Other Topics Concern  . Not on file  Social History Narrative  . Not on file    Allergies:  Allergies  Allergen Reactions  . Latex Itching    LATEX RAST NEGATIVE 1 WEEK AGO PATIENT STATES  . Adhesive [Tape] Rash  . Codeine Rash    Medications: Prior to Admission medications   Medication Sig Start Date End Date Taking? Authorizing Provider  aspirin EC 325 MG tablet Take 1 tablet (325 mg total) by mouth daily. 03/19/17  Yes Thornton Park, MD  Biotin w/ Vitamins C & E (HAIR/SKIN/NAILS PO) Take 1 tablet by mouth daily.   Yes [provider]  Calcium Citrate-Vitamin D (CALCIUM + D PO) Take 1 tablet by mouth daily.   Yes [provider]  Cyanocobalamin (B-12 SL) Place 1 tablet under the tongue daily.   Yes [provider]  Multiple Vitamin (MULTIVITAMIN WITH MINERALS) TABS tablet Take 1 tablet by mouth daily.   Yes [provider]  naproxen sodium (ANAPROX) 220 MG tablet Take 220-440 mg by mouth 2 (two) times daily as needed (pain).    Yes [provider]  phentermine (ADIPEX-P) 37.5 MG tablet Take 1 tablet (37.5 mg total) by mouth daily before breakfast. 07/26/18  Yes [name], MD    Physical Exam Blood pressure 132/88, pulse 70, height 5\' 4"  (1.626 m), weight 206 lb (93.4 kg). Wt Readings from Last 3 Encounters:  08/19/18 206 lb (93.4 kg)  07/23/18 215 lb (97.5 kg)  08/13/17 200 lb (90.7 kg)  Body  mass index is 35.36 kg/m.   General: NAD HEENT: normocephalic, anicteric Thyroid: no enlargement Pulmonary: no increased work of breathing Neurologic: Grossly intact Psychiatric: mood appropriate, affect full    GYNECOLOGY CLINIC COLPOSCOPY PROCEDURE NOTE  63 y.o. 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 Discussed underlying role for HPV infection in the development of cervical dysplasia, its natural history and progression/regression, need for surveillance.  Is the patient  pregnant: No LMP: No LMP recorded. Patient is postmenopausal. Smoking status:  reports that she  quit smoking about 30 years ago. Her smoking use included cigarettes. She has a 20.00 pack-year smoking history. She has never used smokeless tobacco. Contraception: post menopausal status  Patient given informed consent, signed copy in the chart, time out was performed.  The patient was position in dorsal lithotomy position. Speculum was placed the cervix was visualized.   After application of acetic acid colposcopic inspection of the cervix was undertaken.   Colposcopy adequate, full visualization of transformation zone: Yes acetowhite lesion(s) noted at 1 o'clock; corresponding biopsies obtained.   ECC specimen obtained:  Yes  All specimens were labeled and sent to pathology.   Patient was given post procedure instructions.  Will follow up pathology and manage accordingly.  Routine preventative health maintenance measures emphasized.  OBGyn Exam  [name], MD, [facility], [facility]  Assessment: 63 y.o. G1P0010 medical weight loss follow up and colposcopy  Plan: Problem List Items Addressed This Visit      Other   Abnormal Pap smear of cervix    Other Visit Diagnoses    Class 2 obesity without serious comorbidity with body mass index (BMI) of 35.0  to 35.9 in adult, unspecified obesity type    -  Primary   LGSIL on Pap smear of cervix       Relevant Orders   Surgical pathology   High risk human papilloma virus (HPV) infection of cervix       Relevant Orders   Surgical pathology      1) 1500 Calorie ADA Diet  2) Patient education given regarding appropriate lifestyle changes for weight loss including: regular physical activity, healthy coping strategies, caloric restriction and healthy eating patterns.  3) Patient will be started on weight loss medication. The risks and benefits and side effects of medication, such as Adipex (Phenteramine) ,  Tenuate (Diethylproprion), Belviq (lorcarsin), Contrave (buproprion/naltrexone), Qsymia (phentermine/topiramate), and Saxenda (liraglutide) is discussed. The pros and cons of suppressing appetite and boosting metabolism is discussed. Risks of tolerence and addiction is discussed for selected agents discussed. Use of medicine will ne short term, such as 3-4 months at a time followed by a period of time off of the medicine to avoid these risks and side effects for Adipex, Qsymia, and Tenuate discussed. Pt to call with any negative side effects and agrees to keep follow up appts.  4) Patient to take medication, with the benefits of appetite suppression and metabolism boost d/w pt, along with the side effects and risk factors of long term use that will be avoided with our use of short bursts of therapy. Rx provided.    5) 15 minutes face-to-face; with counseling/coordination of care > 50 percent of visit related to obesity and ongoing management/treatment   6)  Return in about 4 weeks (around 09/16/2018) for Telephone visit medication follow up.    [name], MD, [facility], [facility] 08/19/2018, 11:00 AM

## 2018-08-22 ENCOUNTER — Other Ambulatory Visit: Payer: Self-pay | Admitting: Obstetrics and Gynecology

## 2018-08-24 ENCOUNTER — Ambulatory Visit: Payer: BC Managed Care – PPO | Admitting: Obstetrics and Gynecology

## 2018-09-15 ENCOUNTER — Other Ambulatory Visit: Payer: Self-pay

## 2018-09-15 ENCOUNTER — Encounter: Payer: Self-pay | Admitting: Obstetrics and Gynecology

## 2018-09-15 ENCOUNTER — Ambulatory Visit (INDEPENDENT_AMBULATORY_CARE_PROVIDER_SITE_OTHER): Payer: BC Managed Care – PPO | Admitting: Obstetrics and Gynecology

## 2018-09-15 VITALS — Ht 64.0 in | Wt 203.0 lb

## 2018-09-15 DIAGNOSIS — E669 Obesity, unspecified: Secondary | ICD-10-CM | POA: Diagnosis not present

## 2018-09-15 DIAGNOSIS — Z6834 Body mass index (BMI) 34.0-34.9, adult: Secondary | ICD-10-CM

## 2018-09-15 NOTE — Progress Notes (Signed)
I connected with Charlotte Moses on 09/15/18 at 11:30 AM EDT by telephone and verified that I am speaking with the correct person using two identifiers.   I discussed the limitations, risks, security and privacy concerns of performing an evaluation and management service by telephone and the availability of in person appointments. I also discussed with the patient that there may be a patient responsible charge related to this service. The patient expressed understanding and agreed to proceed.  The patient was at home I spoke with the patient from my workstation phone The names of people involved in this encounter were: Charlotte Moses , and Charlotte Moses  Gynecology Office Visit  Chief Complaint:  Chief Complaint  Patient presents with  . Follow-up    Weight check    History of Present Illness: Patientis a 63 y.o. G24P0010 female, who presents for the evaluation of the desire to lose weight. She has lost 3 pounds 1 months, 2 month total weight loss of 12lbs. The patient states the following symptoms since starting her weight loss therapy: appetite suppression, energy, and weight loss.  The patient also reports no other ill effects. The patient specifically denies heart palpitations, anxiety, and insomnia.    Review of Systems: 10 point review of systems negative unless otherwise noted in HPI  Past Medical History:  Past Medical History:  Diagnosis Date  . Arthritis    knees  . Asthma    altitude induced  . Diabetes mellitus without complication ([place])    PT HAD BARIATRIC SURGERY AND DIABETES NOW RESOLVED  . Hyperlipidemia   . Obesity   . Personal history of colonic polyps     Past Surgical History:  Past Surgical History:  Procedure Laterality Date  . BREAST EXCISIONAL BIOPSY Left 1990   benign  . CATARACT EXTRACTION W/PHACO Left 11/19/2016   Procedure: CATARACT EXTRACTION PHACO AND INTRAOCULAR LENS PLACEMENT (IOC);  Surgeon: Estill Cotta, MD;  Location: [facility] ORS;   Service: Ophthalmology;  Laterality: Left;  [place] 00:57 AP% 23.8 CDE 28.01 Fluid pack lot # [id number]  . CATARACT EXTRACTION W/PHACO Right 05/13/2017   Procedure: CATARACT EXTRACTION PHACO AND INTRAOCULAR LENS PLACEMENT ([place])  RIGHT;  Surgeon: Leandrew Koyanagi, MD;  Location: [place];  Service: Ophthalmology;  Laterality: Right;  . COLONOSCOPY N/A 2008, 2011  . COLONOSCOPY WITH PROPOFOL N/A 05/30/2015   Procedure: COLONOSCOPY WITH PROPOFOL;  Surgeon: Robert Bellow, MD;  Location: [facility] ENDOSCOPY;  Service: Endoscopy;  Laterality: N/A;  . EYE SURGERY Bilateral ~10 years ago   corrective  . HEMORROIDECTOMY  07/30/2006   External hemorrhoid removed.  Marland Kitchen KNEE ARTHROSCOPY WITH MEDIAL MENISECTOMY Left 03/19/2017   Procedure: KNEE ARTHROSCOPY WITH MEDIAL MENISECTOMY, LIMITED SYNOVECTOMY, CHONDROPLASTY, EXCISION OF OSTEOPHYTES;  Surgeon: Thornton Park, MD;  Location: [facility] ORS;  Service: Orthopedics;  Laterality: Left;  . KNEE SURGERY Left ~25 years ago  . LAPAROSCOPIC GASTRIC SLEEVE RESECTION N/A 09/05/2013   Procedure: LAPAROSCOPIC GASTRIC SLEEVE RESECTION and REPAIR OF HIATAL HERNIA, upper endoscopy;  Surgeon: Gayland Curry, MD;  Location: [facility];  Service: General;  Laterality: N/A;  . LIPOSUCTION    . POLYPECTOMY  2008   benign  . VEIN SURGERY Right more than 10 years ago   varicose veins    Gynecologic History: No LMP recorded. Patient is postmenopausal.  Obstetric History: G1P0010  Family History:  Family History  Problem Relation Age of Onset  . Lymphoma Mother   . Arthritis Father   . Hyperlipidemia Other   .  Hypertension Other   . Obesity Other   . Ulcers Other   . Pancreatic cancer Paternal Grandfather 37  . Breast cancer Neg Hx     Social History:  Social History   Socioeconomic History  . Marital status: Single    Spouse name: Not on file  . Number of children: Not on file  . Years of education: Not on file  . Highest education level: Not on file   Occupational History  . Not on file  Social Needs  . Financial resource strain: Not on file  . Food insecurity:    Worry: Not on file    Inability: Not on file  . Transportation needs:    Medical: Not on file    Non-medical: Not on file  Tobacco Use  . Smoking status: Former Smoker    Packs/day: 1.00    Years: 20.00    Pack years: 20.00    Types: Cigarettes    Last attempt to quit: 05/26/1988    Years since quitting: 30.3  . Smokeless tobacco: Never Used  Substance and Sexual Activity  . Alcohol use: Yes    Comment: occasional  . Drug use: No  . Sexual activity: Yes    Birth control/protection: None  Lifestyle  . Physical activity:    Days per week: 5 days    Minutes per session: 30 min  . Stress: Not at all  Relationships  . Social connections:    Talks on phone: More than three times a week    Gets together: More than three times a week    Attends religious service: 1 to 4 times per year    Active member of club or organization: No    Attends meetings of clubs or organizations: Never    Relationship status: Never married  . Intimate partner violence:    Fear of current or ex partner: No    Emotionally abused: No    Physically abused: No    Forced sexual activity: No  Other Topics Concern  . Not on file  Social History Narrative  . Not on file    Allergies:  Allergies  Allergen Reactions  . Latex Itching    LATEX RAST NEGATIVE 1 WEEK AGO PATIENT STATES  . Adhesive [Tape] Rash  . Codeine Rash    Medications: Prior to Admission medications   Medication Sig Start Date End Date Taking? Authorizing Provider  Biotin w/ Vitamins C & E (HAIR/SKIN/NAILS PO) Take 1 tablet by mouth daily.   Yes [provider]  Calcium Citrate-Vitamin D (CALCIUM + D PO) Take 1 tablet by mouth daily.   Yes [provider]  Cyanocobalamin (B-12 SL) Place 1 tablet under the tongue daily.   Yes [provider]  Multiple Vitamin (MULTIVITAMIN WITH MINERALS)  TABS tablet Take 1 tablet by mouth daily.   Yes [provider]  naproxen sodium (ANAPROX) 220 MG tablet Take 220-440 mg by mouth 2 (two) times daily as needed (pain).    Yes [provider]  phentermine (ADIPEX-P) 37.5 MG tablet TAKE 1 TABLET BY MOUTH DAILY BEFORE BREAKFAST 08/23/18  Yes Charlotte Mood, MD  aspirin EC 325 MG tablet Take 1 tablet (325 mg total) by mouth daily. Patient not taking: Reported on 09/15/2018 03/19/17   Thornton Park, MD    Physical Exam Height 5\' 4"  (1.626 m), weight 203 lb (92.1 kg). Wt Readings from Last 3 Encounters:  09/15/18 203 lb (92.1 kg)  08/19/18 206 lb (93.4 kg)  07/23/18 215 lb (97.5 kg)  Body mass index is 34.84 kg/m.  No physical exam as this was a remote telephone visit to promote social distancing during the current COVID-19 Pandemic   Assessment: 63 y.o. G1P0010 No problem-specific Assessment & Plan notes found for this encounter.   Plan: Problem List Items Addressed This Visit    None    Visit Diagnoses    Class 1 obesity without serious comorbidity with body mass index (BMI) of 34.0 to 34.9 in adult, unspecified obesity type    -  Primary      1) 1500 Calorie ADA Diet  2) Patient education given regarding appropriate lifestyle changes for weight loss including: regular physical activity, healthy coping strategies, caloric restriction and healthy eating patterns.  3) Patient to take medication, with the benefits of appetite suppression and metabolism boost d/w pt, along with the side effects and risk factors of long term use that will be avoided with our use of short bursts of therapy. Rx provided. Discussed discontinuation if weight loss continues to drop on month 3 with either 3 month cessation vs trial of longer term agent.   4) Telephone time 5 minutes  5)  Return in about 4 weeks (around 10/13/2018) for medication follow up phone.    Charlotte Mood, MD, [facility], [facility] 09/15/2018, 11:17 AM

## 2018-09-16 ENCOUNTER — Ambulatory Visit: Payer: BC Managed Care – PPO | Admitting: Obstetrics and Gynecology

## 2018-09-16 MED ORDER — PHENTERMINE HCL 37.5 MG PO TABS: ORAL_TABLET | ORAL | 0 refills | Status: DC

## 2018-10-14 ENCOUNTER — Ambulatory Visit (INDEPENDENT_AMBULATORY_CARE_PROVIDER_SITE_OTHER): Payer: BC Managed Care – PPO | Admitting: Obstetrics and Gynecology

## 2018-10-14 ENCOUNTER — Other Ambulatory Visit: Payer: Self-pay

## 2018-10-14 ENCOUNTER — Encounter: Payer: Self-pay | Admitting: Obstetrics and Gynecology

## 2018-10-14 VITALS — Ht 64.0 in | Wt 201.0 lb

## 2018-10-14 DIAGNOSIS — E669 Obesity, unspecified: Secondary | ICD-10-CM

## 2018-10-14 DIAGNOSIS — Z6834 Body mass index (BMI) 34.0-34.9, adult: Secondary | ICD-10-CM | POA: Diagnosis not present

## 2018-10-14 MED ORDER — NALTREXONE-BUPROPION HCL ER 8-90 MG PO TB12: ORAL_TABLET | ORAL | 0 refills | Status: DC

## 2018-10-14 NOTE — Progress Notes (Signed)
I connected with Charlotte Moses on 10/14/18 at 10:50 AM EDT by telephone and verified that I am speaking with the correct person using two identifiers.   I discussed the limitations, risks, security and privacy concerns of performing an evaluation and management service by telephone and the availability of in person appointments. I also discussed with the patient that there may be a patient responsible charge related to this service. The patient expressed understanding and agreed to proceed.  The patient was at home I spoke with the patient from my workstation phone The names of people involved in this encounter were: Charlotte Moses , and Charlotte Moses   Gynecology Office Visit  Chief Complaint:  Chief Complaint  Patient presents with  . Weight Check    History of Present Illness: Patientis a 63 y.o. G47P0010 female, who presents for the evaluation of the desire to lose weight. She has lost 2 pounds 1 months, 14lbs in 3 months. The patient states the following symptoms since starting her weight loss therapy: appetite suppression, energy, and weight loss.  The patient also reports no other ill effects. The patient specifically denies heart palpitations, anxiety, and insomnia.    Review of Systems: 10 point review of systems negative unless otherwise noted in HPI  Past Medical History:  Past Medical History:  Diagnosis Date  . Arthritis    knees  . Asthma    altitude induced  . Diabetes mellitus without complication ([employer])    PT HAD BARIATRIC SURGERY AND DIABETES NOW RESOLVED  . Hyperlipidemia   . Obesity   . Personal history of colonic polyps     Past Surgical History:  Past Surgical History:  Procedure Laterality Date  . BREAST EXCISIONAL BIOPSY Left 1990   benign  . CATARACT EXTRACTION W/PHACO Left 11/19/2016   Procedure: CATARACT EXTRACTION PHACO AND INTRAOCULAR LENS PLACEMENT (IOC);  Surgeon: Estill Cotta, MD;  Location: [facility] ORS;  Service: Ophthalmology;   Laterality: Left;  [place] 00:57 AP% 23.8 CDE 28.01 Fluid pack lot # [id number]  . CATARACT EXTRACTION W/PHACO Right 05/13/2017   Procedure: CATARACT EXTRACTION PHACO AND INTRAOCULAR LENS PLACEMENT ([place])  RIGHT;  Surgeon: Leandrew Koyanagi, MD;  Location: [place];  Service: Ophthalmology;  Laterality: Right;  . COLONOSCOPY N/A 2008, 2011  . COLONOSCOPY WITH PROPOFOL N/A 05/30/2015   Procedure: COLONOSCOPY WITH PROPOFOL;  Surgeon: Robert Bellow, MD;  Location: [facility] ENDOSCOPY;  Service: Endoscopy;  Laterality: N/A;  . EYE SURGERY Bilateral ~10 years ago   corrective  . HEMORROIDECTOMY  07/30/2006   External hemorrhoid removed.  Marland Kitchen KNEE ARTHROSCOPY WITH MEDIAL MENISECTOMY Left 03/19/2017   Procedure: KNEE ARTHROSCOPY WITH MEDIAL MENISECTOMY, LIMITED SYNOVECTOMY, CHONDROPLASTY, EXCISION OF OSTEOPHYTES;  Surgeon: Thornton Park, MD;  Location: [facility] ORS;  Service: Orthopedics;  Laterality: Left;  . KNEE SURGERY Left ~25 years ago  . LAPAROSCOPIC GASTRIC SLEEVE RESECTION N/A 09/05/2013   Procedure: LAPAROSCOPIC GASTRIC SLEEVE RESECTION and REPAIR OF HIATAL HERNIA, upper endoscopy;  Surgeon: Gayland Curry, MD;  Location: [facility];  Service: General;  Laterality: N/A;  . LIPOSUCTION    . POLYPECTOMY  2008   benign  . VEIN SURGERY Right more than 10 years ago   varicose veins    Gynecologic History: No LMP recorded. Patient is postmenopausal.  Obstetric History: G1P0010  Family History:  Family History  Problem Relation Age of Onset  . Lymphoma Mother   . Arthritis Father   . Hyperlipidemia Other   . Hypertension Other   .  Obesity Other   . Ulcers Other   . Pancreatic cancer Paternal Grandfather 52  . Breast cancer Neg Hx     Social History:  Social History   Socioeconomic History  . Marital status: Single    Spouse name: Not on file  . Number of children: Not on file  . Years of education: Not on file  . Highest education level: Not on file  Occupational History  .  Not on file  Social Needs  . Financial resource strain: Not on file  . Food insecurity:    Worry: Not on file    Inability: Not on file  . Transportation needs:    Medical: Not on file    Non-medical: Not on file  Tobacco Use  . Smoking status: Former Smoker    Packs/day: 1.00    Years: 20.00    Pack years: 20.00    Types: Cigarettes    Last attempt to quit: 05/26/1988    Years since quitting: 30.4  . Smokeless tobacco: Never Used  Substance and Sexual Activity  . Alcohol use: Yes    Comment: occasional  . Drug use: No  . Sexual activity: Yes    Birth control/protection: None  Lifestyle  . Physical activity:    Days per week: 5 days    Minutes per session: 30 min  . Stress: Not at all  Relationships  . Social connections:    Talks on phone: More than three times a week    Gets together: More than three times a week    Attends religious service: 1 to 4 times per year    Active member of club or organization: No    Attends meetings of clubs or organizations: Never    Relationship status: Never married  . Intimate partner violence:    Fear of current or ex partner: No    Emotionally abused: No    Physically abused: No    Forced sexual activity: No  Other Topics Concern  . Not on file  Social History Narrative  . Not on file    Allergies:  Allergies  Allergen Reactions  . Latex Itching    LATEX RAST NEGATIVE 1 WEEK AGO PATIENT STATES  . Adhesive [Tape] Rash  . Codeine Rash    Medications: Prior to Admission medications   Medication Sig Start Date End Date Taking? Authorizing Provider  Biotin w/ Vitamins C & E (HAIR/SKIN/NAILS PO) Take 1 tablet by mouth daily.   Yes [provider]  Calcium Citrate-Vitamin D (CALCIUM + D PO) Take 1 tablet by mouth daily.   Yes [provider]  Cyanocobalamin (B-12 SL) Place 1 tablet under the tongue daily.   Yes [provider]  Multiple Vitamin (MULTIVITAMIN WITH MINERALS) TABS tablet Take 1 tablet  by mouth daily.   Yes [provider]  naproxen sodium (ANAPROX) 220 MG tablet Take 220-440 mg by mouth 2 (two) times daily as needed (pain).    Yes [provider]  phentermine (ADIPEX-P) 37.5 MG tablet TAKE 1 TABLET BY MOUTH DAILY BEFORE BREAKFAST 09/16/18  Yes Charlotte Mood, MD    Physical Exam Height 5\' 4"  (1.626 m), weight 201 lb (91.2 kg). Wt Readings from Last 3 Encounters:  10/14/18 201 lb (91.2 kg)  09/15/18 203 lb (92.1 kg)  08/19/18 206 lb (93.4 kg)  Body mass index is 34.5 kg/m.  No physical exam as this was a remote telephone visit to promote social distancing during the current COVID-19 Pandemic  Assessment: 63 y.o. G1P0010 medical weight loss follow up  Plan: Problem List Items Addressed This Visit    None    Visit Diagnoses    Class 1 obesity without serious comorbidity with body mass index (BMI) of 34.0 to 34.9 in adult, unspecified obesity type    -  Primary   Relevant Medications   Naltrexone-buPROPion HCl ER (CONTRAVE) 8-90 MG TB12      1) 1500 Calorie ADA Diet  2) Patient education given regarding appropriate lifestyle changes for weight loss including: regular physical activity, healthy coping strategies, caloric restriction and healthy eating patterns.  3) Patient will be started on weight loss medication. The risks and benefits and side effects of medication, such as Adipex (Phenteramine) ,  Tenuate (Diethylproprion), Belviq (lorcarsin), Contrave (buproprion/naltrexone), Qsymia (phentermine/topiramate), and Saxenda (liraglutide) is discussed. The pros and cons of suppressing appetite and boosting metabolism is discussed. Risks of tolerence and addiction is discussed for selected agents discussed. Use of medicine will ne short term, such as 3-4 months at a time followed by a period of time off of the medicine to avoid these risks and side effects for Adipex, Qsymia, and Tenuate discussed. Pt to call with any negative side effects and agrees  to keep follow up appts. - given decreasing efficacy of phentermine at this point given option of 3 months off and revisiting phentermine vs trial of Contrave.  Patient opts for Contrave trial. - 5lbs safety net   4) Telephone time 9:28 min  5)  Return in about 3 months (around 01/14/2019).    Charlotte Mood, MD, [facility], [facility] 10/14/2018, 11:20 AM

## 2018-11-12 ENCOUNTER — Other Ambulatory Visit: Payer: Self-pay | Admitting: Obstetrics and Gynecology

## 2018-11-12 NOTE — Telephone Encounter (Signed)
Needs PA 

## 2018-11-12 NOTE — Telephone Encounter (Addendum)
PA has been started today 6/19  through CoverMyMeds.   PA was approved through [employer]. Pt aware ok to pick up from pharmacy

## 2018-12-08 ENCOUNTER — Other Ambulatory Visit: Payer: Self-pay | Admitting: Obstetrics and Gynecology

## 2018-12-30 ENCOUNTER — Encounter: Payer: Self-pay | Admitting: General Surgery

## 2018-12-31 ENCOUNTER — Ambulatory Visit
Admission: RE | Admit: 2018-12-31 | Discharge: 2018-12-31 | Disposition: A | Discharge status: Home / Self Care | Payer: BC Managed Care – PPO | Source: Ambulatory Visit | Attending: Obstetrics and Gynecology | Admitting: Obstetrics and Gynecology

## 2018-12-31 ENCOUNTER — Other Ambulatory Visit: Payer: Self-pay | Admitting: Obstetrics and Gynecology

## 2018-12-31 DIAGNOSIS — Z1239 Encounter for other screening for malignant neoplasm of breast: Secondary | ICD-10-CM

## 2018-12-31 DIAGNOSIS — Z1231 Encounter for screening mammogram for malignant neoplasm of breast: Secondary | ICD-10-CM | POA: Insufficient documentation

## 2019-03-15 ENCOUNTER — Telehealth: Payer: Self-pay

## 2019-03-15 NOTE — Telephone Encounter (Signed)
CoverMyMeds faxed over a PA for [place]. Pt has not been seen since 09/2018, refill was sent in 11/2018. There are no future appointments for pt. Please advise

## 2019-03-18 NOTE — Telephone Encounter (Signed)
LVM for patient to call back to give update on status if still on medication. Pt needs appointment with AMS if she prefers to restart medication.

## 2019-03-18 NOTE — Telephone Encounter (Signed)
I went ahead and did the prior authorization on the Saxenda. It has been approved through her insurance. KJ CMA

## 2019-03-18 NOTE — Telephone Encounter (Signed)
In order for it to get authorized by insurance we need a 3 month follow up if this was new start to see if she has sufficient weight loss

## 2019-03-18 NOTE — Telephone Encounter (Signed)
Patient is schedule 03/21/19

## 2019-03-22 ENCOUNTER — Other Ambulatory Visit: Payer: Self-pay

## 2019-03-22 ENCOUNTER — Encounter: Payer: Self-pay | Admitting: Obstetrics and Gynecology

## 2019-03-22 ENCOUNTER — Ambulatory Visit (INDEPENDENT_AMBULATORY_CARE_PROVIDER_SITE_OTHER): Payer: BC Managed Care – PPO | Admitting: Obstetrics and Gynecology

## 2019-03-22 VITALS — Wt 188.0 lb

## 2019-03-22 DIAGNOSIS — Z6832 Body mass index (BMI) 32.0-32.9, adult: Secondary | ICD-10-CM

## 2019-03-22 DIAGNOSIS — E669 Obesity, unspecified: Secondary | ICD-10-CM

## 2019-03-22 MED ORDER — SAXENDA 18 MG/3ML ~~LOC~~ SOPN: 3.0000 mg | PEN_INJECTOR | SUBCUTANEOUS | 3 refills | Status: DC

## 2019-03-22 NOTE — Progress Notes (Signed)
I connected with Charlotte Moses  on 03/22/19 at  8:50 AM EDT by telephone and verified that I am speaking with the correct person using two identifiers.   I discussed the limitations, risks, security and privacy concerns of performing an evaluation and management service by telephone and the availability of in person appointments. I also discussed with the patient that there may be a patient responsible charge related to this service. The patient expressed understanding and agreed to proceed.  The patient was at home. I spoke with the patient from my workstation phone. The names of people involved in this encounter were: Charlotte Moses , and Charlotte Moses  Gynecology Office Visit  Chief Complaint:  Chief Complaint  Patient presents with   Weight Check    History of Present Illness: Patientis a 63 y.o. G68P0010 female, who presents for the evaluation of the desire to lose weight. She has lost 13 pounds 5 months. The patient states the following symptoms since starting her weight loss therapy: appetite suppression, energy, and weight loss.  The patient also reports no other ill effects. The patient specifically denies heart palpitations, anxiety, and insomnia.    Review of Systems: 10 point review of systems negative unless otherwise noted in HPI  Past Medical History:  Past Medical History:  Diagnosis Date   Arthritis    knees   Asthma    altitude induced   Diabetes mellitus without complication ([place])    PT HAD BARIATRIC SURGERY AND DIABETES NOW RESOLVED   Hyperlipidemia    Obesity    Personal history of colonic polyps     Past Surgical History:  Past Surgical History:  Procedure Laterality Date   BREAST EXCISIONAL BIOPSY Left 1990   benign   CATARACT EXTRACTION W/PHACO Left 11/19/2016   Procedure: CATARACT EXTRACTION PHACO AND INTRAOCULAR LENS PLACEMENT ([place]);  Surgeon: Estill Cotta, MD;  Location: [facility];  Service: Ophthalmology;  Laterality: Left;  [place]  00:57 AP% 23.8 CDE 28.01 Fluid pack lot # [id number]   CATARACT EXTRACTION W/PHACO Right 05/13/2017   Procedure: CATARACT EXTRACTION PHACO AND INTRAOCULAR LENS PLACEMENT ([place])  RIGHT;  Surgeon: Leandrew Koyanagi, MD;  Location: [place];  Service: Ophthalmology;  Laterality: Right;   COLONOSCOPY N/A 2008, 2011   COLONOSCOPY WITH PROPOFOL N/A 05/30/2015   Procedure: COLONOSCOPY WITH PROPOFOL;  Surgeon: Robert Bellow, MD;  Location: [facility] ENDOSCOPY;  Service: Endoscopy;  Laterality: N/A;   EYE SURGERY Bilateral ~10 years ago   corrective   HEMORROIDECTOMY  07/30/2006   External hemorrhoid removed.   KNEE ARTHROSCOPY WITH MEDIAL MENISECTOMY Left 03/19/2017   Procedure: KNEE ARTHROSCOPY WITH MEDIAL MENISECTOMY, LIMITED SYNOVECTOMY, CHONDROPLASTY, EXCISION OF OSTEOPHYTES;  Surgeon: Thornton Park, MD;  Location: [facility];  Service: Orthopedics;  Laterality: Left;   KNEE SURGERY Left ~25 years ago   [place] N/A 09/05/2013   Procedure: LAPAROSCOPIC GASTRIC SLEEVE RESECTION and REPAIR OF HIATAL HERNIA, upper endoscopy;  Surgeon: Gayland Curry, MD;  Location: [facility];  Service: General;  Laterality: N/A;   LIPOSUCTION     POLYPECTOMY  2008   benign   VEIN SURGERY Right more than 10 years ago   varicose veins    Gynecologic History: No LMP recorded. Patient is postmenopausal.  Obstetric History: G1P0010  Family History:  Family History  Problem Relation Age of Onset   Lymphoma Mother    Arthritis Father    Hyperlipidemia Other    Hypertension Other    Obesity  Other    Ulcers Other    Pancreatic cancer Paternal Grandfather 86   Breast cancer Neg Hx     Social History:  Social History   Socioeconomic History   Marital status: Single    Spouse name: Not on file   Number of children: Not on file   Years of education: Not on file   Highest education level: Not on file  Occupational History   Not on file  Social  Needs   Financial resource strain: Not on file   Food insecurity    Worry: Not on file    Inability: Not on file   Transportation needs    Medical: Not on file    Non-medical: Not on file  Tobacco Use   Smoking status: Former Smoker    Packs/day: 1.00    Years: 20.00    Pack years: 20.00    Types: Cigarettes    Quit date: 05/26/1988    Years since quitting: 30.8   Smokeless tobacco: Never Used  Substance and Sexual Activity   Alcohol use: Yes    Comment: occasional   Drug use: No   Sexual activity: Yes    Birth control/protection: None  Lifestyle   Physical activity    Days per week: 5 days    Minutes per session: 30 min   Stress: Not at all  Relationships   Social connections    Talks on phone: More than three times a week    Gets together: More than three times a week    Attends religious service: 1 to 4 times per year    Active member of club or organization: No    Attends meetings of clubs or organizations: Never    Relationship status: Never married   Intimate partner violence    Fear of current or ex partner: No    Emotionally abused: No    Physically abused: No    Forced sexual activity: No  Other Topics Concern   Not on file  Social History Narrative   Not on file    Allergies:  Allergies  Allergen Reactions   Latex Itching    LATEX RAST NEGATIVE 1 WEEK AGO PATIENT STATES   Adhesive [Tape] Rash   Codeine Rash    Medications: Prior to Admission medications   Medication Sig Start Date End Date Taking? Authorizing Provider  Biotin w/ Vitamins C & E (HAIR/SKIN/NAILS PO) Take 1 tablet by mouth daily.   Yes [provider]  Calcium Citrate-Vitamin D (CALCIUM + D PO) Take 1 tablet by mouth daily.   Yes [provider]  Cyanocobalamin (B-12 SL) Place 1 tablet under the tongue daily.   Yes [provider]  Multiple Vitamin (MULTIVITAMIN WITH MINERALS) TABS tablet Take 1 tablet by mouth daily.   Yes [provider]  naproxen sodium (ANAPROX) 220 MG tablet Take 220-440 mg by mouth 2 (two) times daily as needed (pain).    Yes [provider]  SAXENDA 18 MG/3ML SOPN USE AS DIRECTED 12/08/18  Yes Charlotte Mood, MD  phentermine (ADIPEX-P) 37.5 MG tablet TAKE 1 TABLET BY MOUTH DAILY BEFORE BREAKFAST Patient not taking: Reported on 03/22/2019 09/16/18   Charlotte Mood, MD    Physical Exam Weight 188 lb (85.3 kg). Wt Readings from Last 3 Encounters:  03/22/19 188 lb (85.3 kg)  10/14/18 201 lb (91.2 kg)  09/15/18 203 lb (92.1 kg)  Body mass index is 32.27 kg/m.  No physical exam as this was a  remote telephone visit to promote social distancing during the current COVID-19 Pandemic  Assessment: 63 y.o. G1P0010 medical weight loss follow up  Plan: Problem List Items Addressed This Visit    None    Visit Diagnoses    Class 1 obesity without serious comorbidity with body mass index (BMI) of 32.0 to 32.9 in adult, unspecified obesity type    -  Primary   Relevant Medications   Liraglutide -Weight Management (SAXENDA) 18 MG/3ML SOPN      1) 1500 Calorie ADA Diet  2) Patient encouraged to continue appropriate lifestyle changes for weight loss including: regular physical activity, healthy coping strategies, caloric restriction and healthy eating patterns.  3) Patient to take medication, with the benefits of appetite suppression and metabolism boost d/w pt, along with the side effects and risk factors of long term use that will be avoided with our use of short bursts of therapy. Rx provided.  Continue Saxenda excellent response   4) Phone time 3 minutes  5)  Return in about 5 months (around 08/20/2019) for annual.    Charlotte Mood, MD, [place], [place] 03/22/2019, 9:23 AM

## 2019-03-26 IMAGING — MG MM DIGITAL SCREENING BILAT W/ TOMO W/ CAD
8 of 12 series · 8 of 28 positions shown · non-contrast
Comparison: Previous exam(s).

ACR Breast Density Category a: The breast tissue is almost entirely
fatty.

CLINICAL DATA: Screening.

EXAM:
DIGITAL SCREENING BILATERAL MAMMOGRAM WITH TOMO AND CAD

[R MLO]
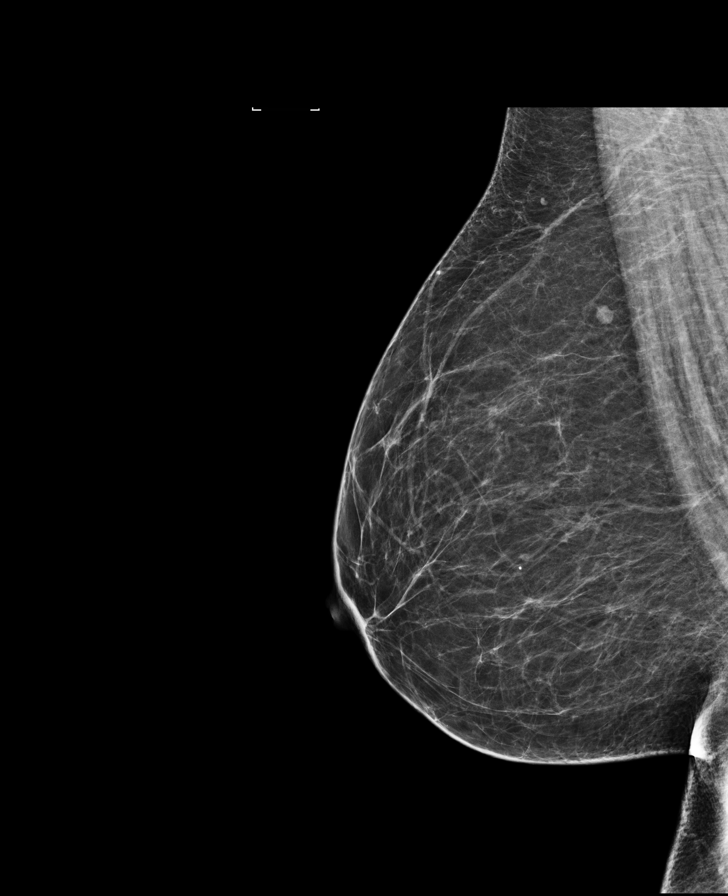

[L CC]
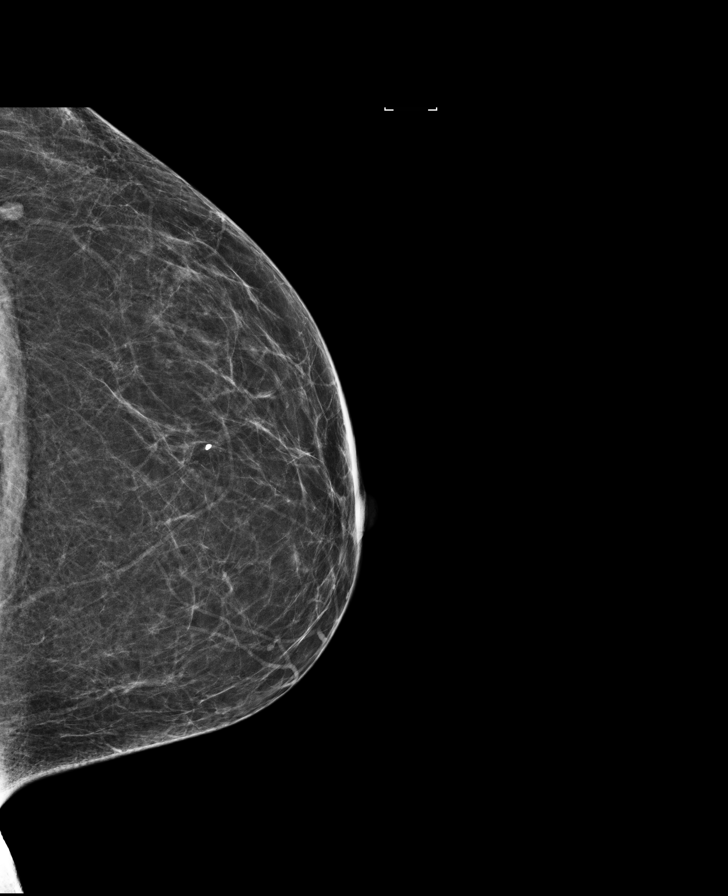

[R MLO synth-2D]
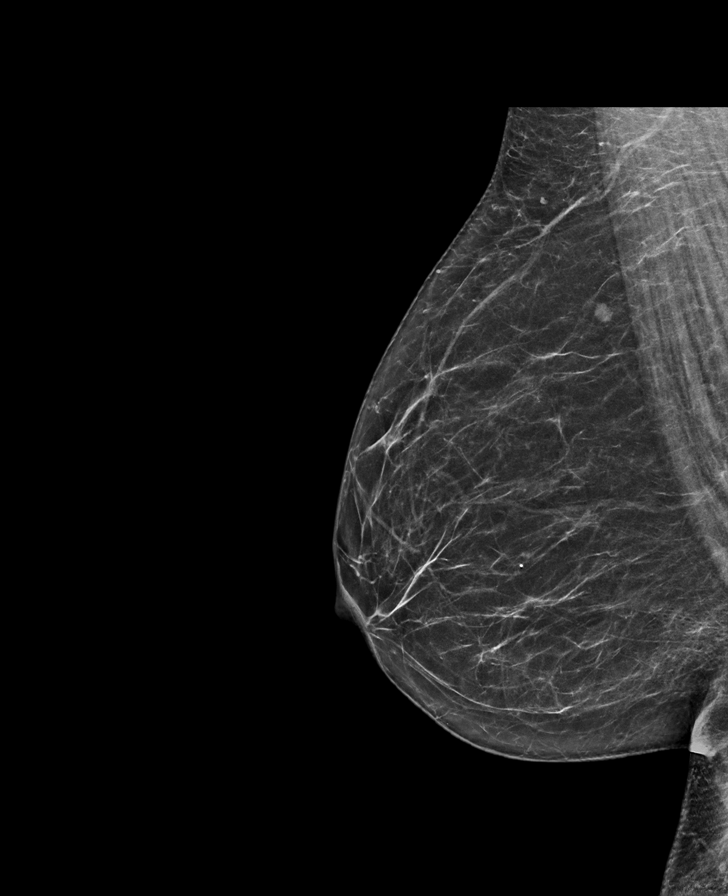

[L CC synth-2D]
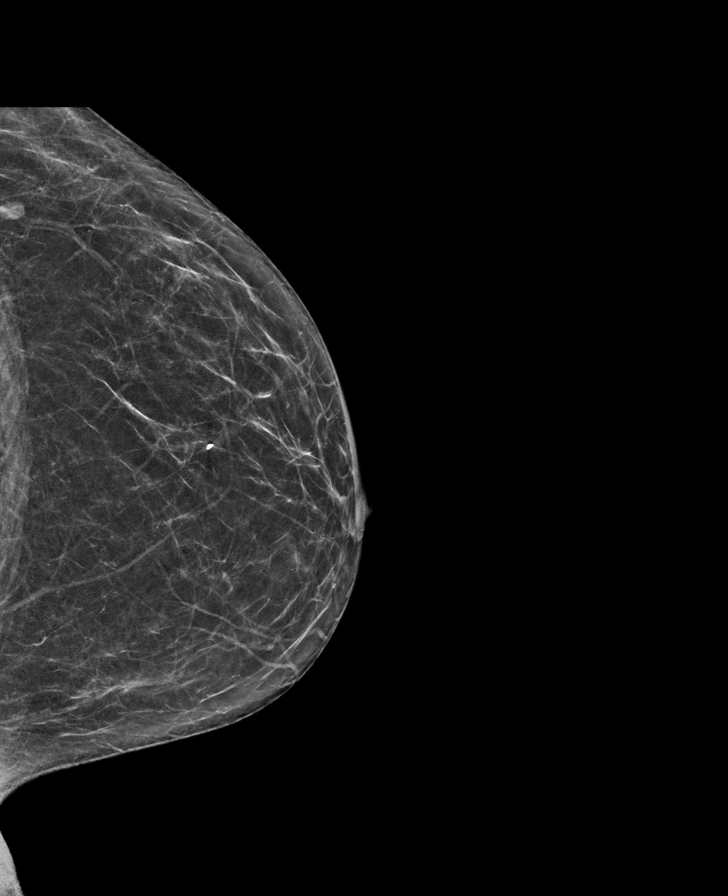

[L MLO synth-2D]
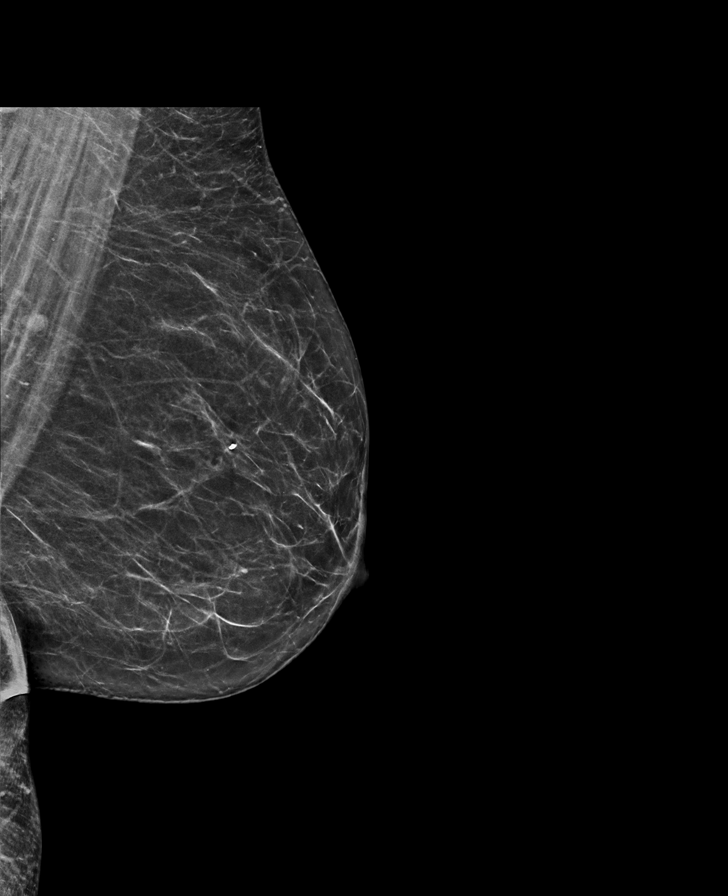

[R CC synth-2D]
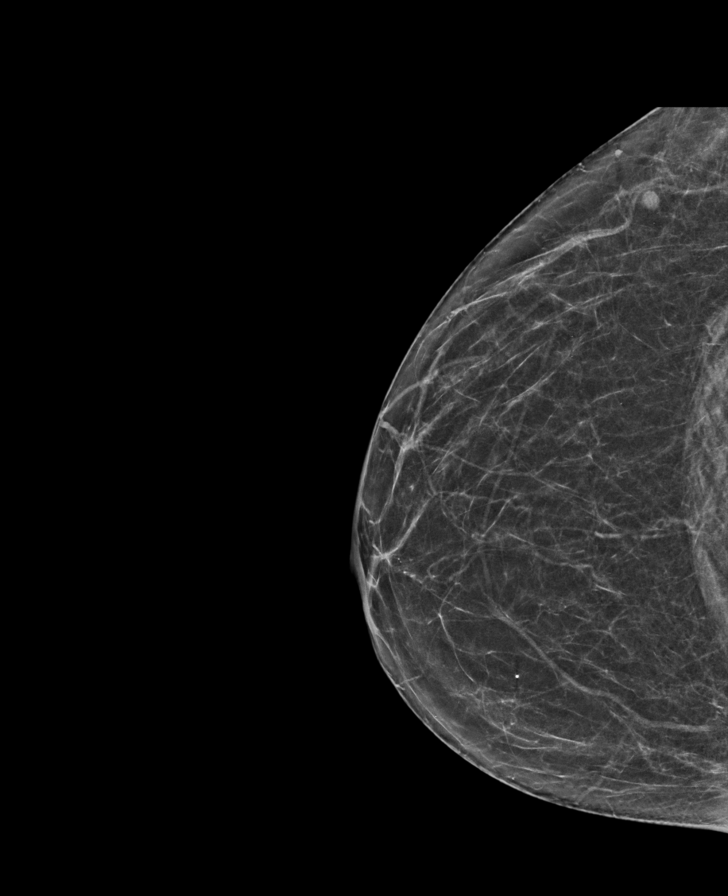

[L MLO]
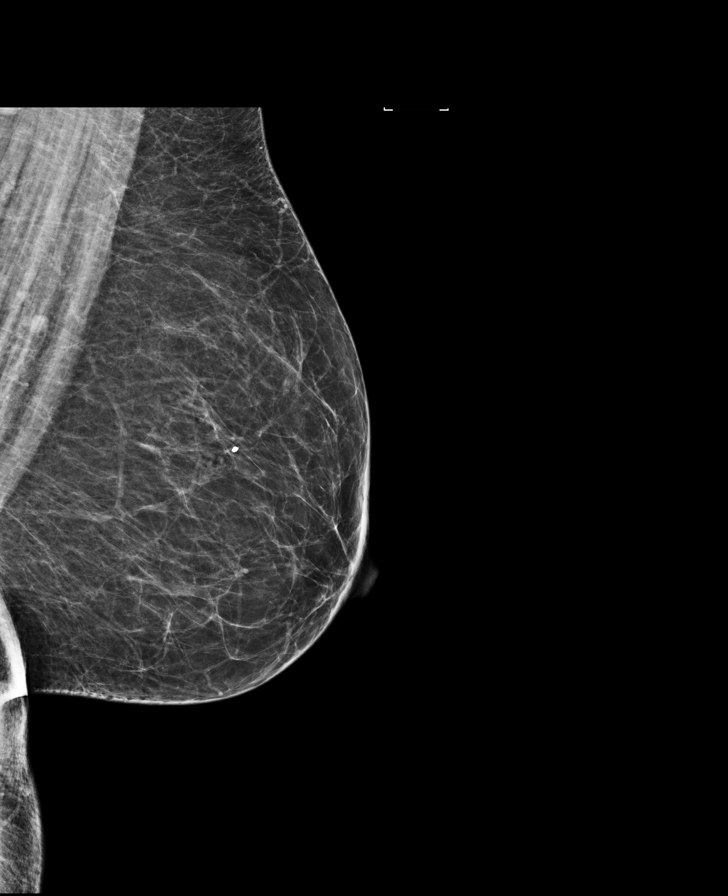

[R CC]
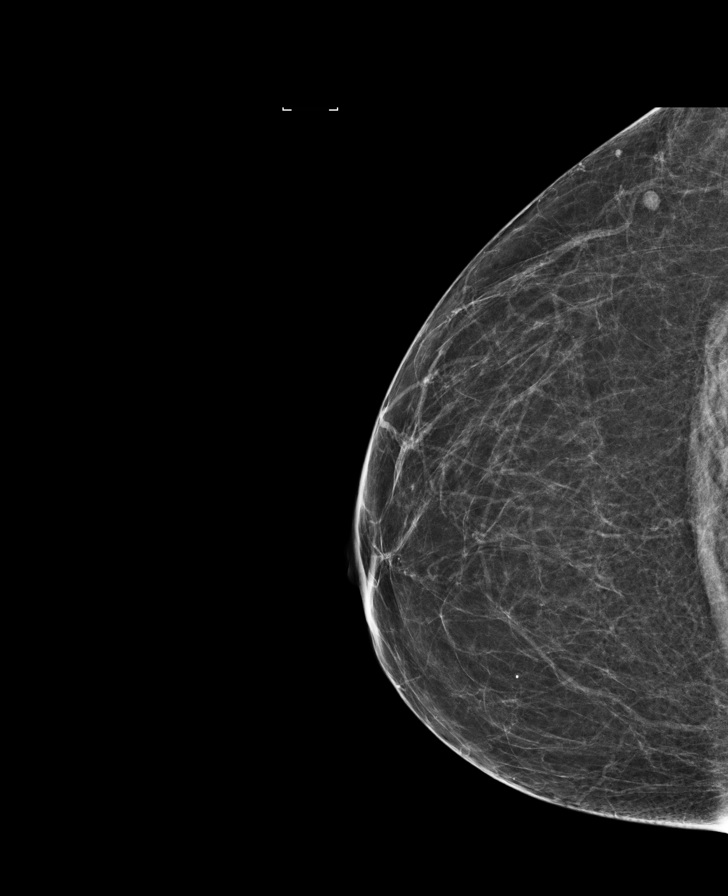

[8 of 28 positions shown; findings below may reference images not displayed]

FINDINGS: There are no findings suspicious for malignancy. Images were
processed with CAD.
IMPRESSION: No mammographic evidence of malignancy. A result letter of this
screening mammogram will be mailed directly to the patient.

RECOMMENDATION:
Screening mammogram in one year. (Code:[id number]-[id number])

BI-RADS CATEGORY  1: Negative.

## 2019-08-19 ENCOUNTER — Other Ambulatory Visit (HOSPITAL_COMMUNITY)
Admission: RE | Admit: 2019-08-19 | Discharge: 2019-08-19 | Disposition: A | Discharge status: Home / Self Care | Payer: BC Managed Care – PPO | Source: Ambulatory Visit | Attending: Obstetrics and Gynecology | Admitting: Obstetrics and Gynecology

## 2019-08-19 ENCOUNTER — Encounter: Payer: Self-pay | Admitting: Obstetrics and Gynecology

## 2019-08-19 ENCOUNTER — Ambulatory Visit (INDEPENDENT_AMBULATORY_CARE_PROVIDER_SITE_OTHER): Payer: BC Managed Care – PPO | Admitting: Obstetrics and Gynecology

## 2019-08-19 ENCOUNTER — Other Ambulatory Visit: Payer: Self-pay

## 2019-08-19 VITALS — BP 132/86 | HR 56 | Ht 64.0 in | Wt 180.0 lb

## 2019-08-19 DIAGNOSIS — Z1239 Encounter for other screening for malignant neoplasm of breast: Secondary | ICD-10-CM

## 2019-08-19 DIAGNOSIS — Z01419 Encounter for gynecological examination (general) (routine) without abnormal findings: Secondary | ICD-10-CM | POA: Diagnosis not present

## 2019-08-19 DIAGNOSIS — Z124 Encounter for screening for malignant neoplasm of cervix: Secondary | ICD-10-CM

## 2019-08-19 NOTE — Progress Notes (Signed)
Gynecology Annual Exam  PCP: Guadalupe Maple, MD  Chief Complaint:  Chief Complaint  Patient presents with  . Annual Exam    History of Present Illness:Patient is a 64 y.o. G1P0010 presents for annual exam. The patient has no complaints today.   LMP: No LMP recorded. Patient is postmenopausal. NO PMB  The patient does perform self breast exams.  There is no notable family history of breast or ovarian cancer in her family.  The patient wears seatbelts: yes.   The patient has regular exercise: not asked.    The patient denies current symptoms of depression.     Review of Systems: ROS  Past Medical History:  Patient Active Problem List   Diagnosis Date Noted  . 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 Encounter for screening colonoscopy 04/13/2015  . Obesity (BMI 30-39.9) 10/06/2013  . 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 BREAST EXCISIONAL BIOPSY Left 1990   benign  . CATARACT EXTRACTION W/PHACO Left 11/19/2016   Procedure: CATARACT EXTRACTION PHACO AND INTRAOCULAR LENS PLACEMENT (IOC);  Surgeon: Estill Cotta, MD;  Location: [facility]S;  Service: Ophthalmology;  Laterality: Left;  [place] 00:57 AP% 23.8 CDE 28.01 Fluid pack lot # [id number]  . CATARACT EXTRACTION W/PHACO Right 05/13/2017   Procedure: CATARACT EXTRACTION PHACO AND INTRAOCULAR LENS PLACEMENT ([place])  RIGHT;  Surgeon: Leandrew Koyanagi, MD;  Location: [facility];  Service: Ophthalmology;  Laterality: Right;  . COLONOSCOPY N/A 2008, 2011  . COLONOSCOPY WITH PROPOFOL N/A 05/30/2015   Procedure: COLONOSCOPY WITH PROPOFOL;  Surgeon:  Robert Bellow, MD;  Location: [facility] ENDOSCOPY;  Service: Endoscopy;  Laterality: N/A;  . EYE SURGERY Bilateral ~10 years ago   corrective  . HEMORROIDECTOMY  07/30/2006   External hemorrhoid removed.  Marland Kitchen KNEE ARTHROSCOPY WITH MEDIAL MENISECTOMY Left 03/19/2017   Procedure: KNEE ARTHROSCOPY WITH MEDIAL MENISECTOMY, LIMITED SYNOVECTOMY, CHONDROPLASTY, EXCISION OF OSTEOPHYTES;  Surgeon: Thornton Park, MD;  Location: [facility] ORS;  Service: Orthopedics;  Laterality: Left;  . KNEE SURGERY Left ~25 years ago  . LAPAROSCOPIC GASTRIC SLEEVE RESECTION N/A 09/05/2013   Procedure: LAPAROSCOPIC GASTRIC SLEEVE RESECTION and REPAIR OF HIATAL HERNIA, upper endoscopy;  Surgeon: Gayland Curry, MD;  Location: [facility];  Service: General;  Laterality: N/A;  . LIPOSUCTION    . POLYPECTOMY  2008   benign  . VEIN SURGERY Right more than 10 years ago   varicose veins    Gynecologic History:  No LMP recorded. Patient is postmenopausal. Last Pap: Results were: 07/23/2018 LGSIL HPV positive negative colposcopy Last mammogram: 12/31/2018 Results were: BI-RAD I  Obstetric History: G1P0010  Family History:  Family History  Problem Relation Age of Onset  . Lymphoma Mother   . Arthritis Father   . Hyperlipidemia Other   . Hypertension Other   . Obesity Other   . Ulcers Other   . Pancreatic cancer Paternal Grandfather 5  . Breast cancer Neg Hx     Social History:  Social History   Socioeconomic History  . Marital status: Single    Spouse name: Not on file  . Number of children: Not on file  . Years of education: Not on file  .  Highest education level: Not on file  Occupational History  . Not on file  Tobacco Use  . Smoking status: Former Smoker    Packs/day: 1.00    Years: 20.00    Pack years: 20.00    Types: Cigarettes    Quit date: 05/26/1988    Years since quitting: 31.2  . Smokeless tobacco: Never Used  Substance and Sexual Activity  . Alcohol use: Yes    Comment: occasional  . Drug use: No   . Sexual activity: Yes    Birth control/protection: None  Other Topics Concern  . Not on file  Social History Narrative  . Not on file   Social Determinants of Health   Financial Resource Strain:   . Difficulty of Paying Living Expenses:   Food Insecurity:   . Worried About Charity fundraiser in the Last Year:   . Arboriculturist in the Last Year:   Transportation Needs:   . Film/video editor (Medical):   Marland Kitchen Lack of Transportation (Non-Medical):   Physical Activity:   . Days of Exercise per Week:   . Minutes of Exercise per Session:   Stress:   . Feeling of Stress :   Social Connections:   . Frequency of Communication with Friends and Family:   . Frequency of Social Gatherings with Friends and Family:   . Attends Religious Services:   . Active Member of Clubs or Organizations:   . Attends Archivist Meetings:   Marland Kitchen Marital Status:   Intimate Partner Violence:   . Fear of Current or Ex-Partner:   . Emotionally Abused:   Marland Kitchen Physically Abused:   . Sexually Abused:     Allergies:  Allergies  Allergen Reactions  . Latex Itching    LATEX RAST NEGATIVE 1 WEEK AGO PATIENT STATES  . Adhesive [Tape] Rash  . Codeine Rash    Medications: Prior to Admission medications   Medication Sig Start Date End Date Taking? Authorizing Provider  Biotin w/ Vitamins C & E (HAIR/SKIN/NAILS PO) Take 1 tablet by mouth daily.    [provider]  Calcium Citrate-Vitamin D (CALCIUM + D PO) Take 1 tablet by mouth daily.    [provider]  Cyanocobalamin (B-12 SL) Place 1 tablet under the tongue daily.    [provider]  Liraglutide -Weight Management (SAXENDA) 18 MG/3ML SOPN Inject 3 mg into the skin See admin instructions. 03/22/19   [name], MD  Multiple Vitamin (MULTIVITAMIN WITH MINERALS) TABS tablet Take 1 tablet by mouth daily.    [provider]  naproxen sodium (ANAPROX) 220 MG tablet Take 220-440 mg by mouth 2 (two) times  daily as needed (pain).     [provider]  phentermine (ADIPEX-P) 37.5 MG tablet TAKE 1 TABLET BY MOUTH DAILY BEFORE BREAKFAST Patient not taking: Reported on 03/22/2019 09/16/18   [name], MD    Physical Exam Vitals: Blood pressure 132/86, pulse (!) 56, height 5\' 4"  (1.626 m), weight 180 lb (81.6 kg).  General: NAD HEENT: normocephalic, anicteric Thyroid: no enlargement, no palpable nodules Pulmonary: No increased work of breathing, CTAB Cardiovascular: RRR, distal pulses 2+ Breast: Breast symmetrical, no tenderness, no palpable nodules or masses, no skin or nipple retraction present, no nipple discharge.  No axillary or supraclavicular lymphadenopathy. Abdomen: NABS, soft, non-tender, non-distended.  Umbilicus without lesions.  No hepatomegaly, splenomegaly or masses palpable. No evidence of hernia  Genitourinary:  External: Normal external female genitalia.  Normal urethral meatus, normal  Bartholin's and Skene's glands.    Vagina: Normal vaginal mucosa, no evidence of prolapse.    Cervix: Grossly normal in appearance, no bleeding  Uterus: Non-enlarged, mobile, normal contour.  No CMT  Adnexa: ovaries non-enlarged, no adnexal masses  Rectal: deferred  Lymphatic: no evidence of inguinal lymphadenopathy Extremities: no edema, erythema, or tenderness Neurologic: Grossly intact Psychiatric: mood appropriate, affect full  Female chaperone present for pelvic and breast  portions of the physical exam     Assessment: 64 y.o. G1P0010 routine annual exam  Plan: Problem List Items Addressed This Visit    None    Visit Diagnoses    Encounter for gynecological examination without abnormal finding    -  Primary   Screening for malignant neoplasm of cervix       Relevant Orders   Cytology - PAP   Breast screening          1) Mammogram - recommend yearly screening mammogram.  Mammogram Is up to date  2) STI screening  was notoffered and therefore not  obtained  3) ASCCP guidelines and rational discussed.  Patient opts for yearly screening interval  4) Osteoporosis  - per USPTF routine screening DEXA at age 66  5) Routine healthcare maintenance including cholesterol, diabetes screening discussed managed by PCP  6)  Return in about 1 year (around 08/18/2020) for annual.    [name], MD [facility] Pigeon, [facility] 08/19/2019, 3:38 PM

## 2019-08-25 LAB — CYTOLOGY - PAP
Comment: NEGATIVE
Comment: NEGATIVE
Diagnosis: UNDETERMINED — AB
HPV 16: NEGATIVE
HPV 18 / 45: NEGATIVE
High risk HPV: POSITIVE — AB

## 2019-09-08 ENCOUNTER — Other Ambulatory Visit: Payer: BC Managed Care – PPO

## 2019-09-14 ENCOUNTER — Telehealth: Payer: Self-pay | Admitting: Obstetrics and Gynecology

## 2019-09-14 NOTE — Telephone Encounter (Signed)
Called pt to schedule IN OFFICE - LEEP w Dr Georgianne Fick  DOS 5/14 @ 3:30

## 2019-10-07 ENCOUNTER — Other Ambulatory Visit: Payer: Self-pay

## 2019-10-07 ENCOUNTER — Ambulatory Visit (INDEPENDENT_AMBULATORY_CARE_PROVIDER_SITE_OTHER): Payer: BC Managed Care – PPO | Admitting: Obstetrics and Gynecology

## 2019-10-07 ENCOUNTER — Other Ambulatory Visit (HOSPITAL_COMMUNITY)
Admission: RE | Admit: 2019-10-07 | Discharge: 2019-10-07 | Disposition: A | Discharge status: Home / Self Care | Payer: BC Managed Care – PPO | Source: Ambulatory Visit | Attending: Obstetrics and Gynecology | Admitting: Obstetrics and Gynecology

## 2019-10-07 ENCOUNTER — Encounter: Payer: Self-pay | Admitting: Obstetrics and Gynecology

## 2019-10-07 VITALS — BP 136/90 | HR 50 | Ht 64.0 in | Wt 181.0 lb

## 2019-10-07 DIAGNOSIS — R87610 Atypical squamous cells of undetermined significance on cytologic smear of cervix (ASC-US): Secondary | ICD-10-CM | POA: Diagnosis present

## 2019-10-07 DIAGNOSIS — R87810 Cervical high risk human papillomavirus (HPV) DNA test positive: Secondary | ICD-10-CM | POA: Insufficient documentation

## 2019-10-07 NOTE — Progress Notes (Signed)
   LEEP PROCEDURE NOTE  The LEEP has been explained to the patient in detail; risks/benefits reviewed.  The risks include, but are not limited to, bleeding, infection, and the possibility of cervical stenosis or cervical incompetence.  The patient had previously been given information regarding abnormal PAP smears and their relationship to HPV.  We have discussed the natural course and history of HPV, the possibility of incomplete treatment by the LEEP, as well as the possibility of recurrence.  I have reviewed the consent form for LEEP with her, and she fully understands its contents.  We have discussed the procedure itself. I have informed her that following the LEEP she should refrain from intercourse and the use of tampons for three weeks, and that she should also expect some spotting and brown/black discharge over the next several days.  We have discussed the fact that vaginal bleeding, differentiated from spotting, is not normal and that if she should have this complication, she should contact me immediately.  The follow-up after LEEP will be PAP smears or viral typing performed at regular intervals for up to 3-5 years.  Should these all prove to be normal, she will then be back on typical cervical screening.  I have answered all of her questions, and I believe she has an adequate understanding of the LEEP, its implications, and the necessity of follow-up care.  I discussed her colpo results and explained the procedure of LEEP.  All questions were answered and she signed the consent form.    LEEP performed in the usual manner after reviewing the previous colpo findings and results. Acetic acid solution was usesd to identify any abnormal areas of the cervix.  The cervix was cleansed with betadine solution. Local injection of Lidocaine was performed for anesthesia. Ectocervical and then endocervical specimens obtained using the loop electrodes without difficulty.  It was labeled accordingly. The  base and edges of the defect was then cauterized using coagulation current.  [name], MD 10/07/2019 4:28 PM

## 2019-10-11 LAB — SURGICAL PATHOLOGY

## 2020-01-13 ENCOUNTER — Other Ambulatory Visit: Payer: Self-pay | Admitting: Obstetrics and Gynecology

## 2020-02-22 ENCOUNTER — Encounter: Payer: Self-pay | Admitting: Obstetrics and Gynecology

## 2020-03-30 ENCOUNTER — Other Ambulatory Visit: Payer: Self-pay | Admitting: Obstetrics and Gynecology

## 2020-04-06 NOTE — Telephone Encounter (Signed)
Please advise 

## 2020-08-24 ENCOUNTER — Other Ambulatory Visit: Payer: Self-pay

## 2020-08-24 ENCOUNTER — Encounter: Payer: Self-pay | Admitting: Obstetrics and Gynecology

## 2020-08-24 ENCOUNTER — Other Ambulatory Visit (HOSPITAL_COMMUNITY)
Admission: RE | Admit: 2020-08-24 | Discharge: 2020-08-24 | Disposition: A | Discharge status: Home / Self Care | Payer: Medicare PPO | Source: Ambulatory Visit | Attending: Obstetrics and Gynecology | Admitting: Obstetrics and Gynecology

## 2020-08-24 ENCOUNTER — Ambulatory Visit (INDEPENDENT_AMBULATORY_CARE_PROVIDER_SITE_OTHER): Payer: Medicare PPO | Admitting: Obstetrics and Gynecology

## 2020-08-24 VITALS — BP 132/86 | HR 57 | Ht 64.0 in | Wt 189.0 lb

## 2020-08-24 DIAGNOSIS — Z1151 Encounter for screening for human papillomavirus (HPV): Secondary | ICD-10-CM | POA: Diagnosis not present

## 2020-08-24 DIAGNOSIS — R87610 Atypical squamous cells of undetermined significance on cytologic smear of cervix (ASC-US): Secondary | ICD-10-CM | POA: Diagnosis not present

## 2020-08-24 DIAGNOSIS — Z1239 Encounter for other screening for malignant neoplasm of breast: Secondary | ICD-10-CM | POA: Diagnosis not present

## 2020-08-24 DIAGNOSIS — Z124 Encounter for screening for malignant neoplasm of cervix: Secondary | ICD-10-CM | POA: Diagnosis not present

## 2020-08-24 DIAGNOSIS — R87810 Cervical high risk human papillomavirus (HPV) DNA test positive: Secondary | ICD-10-CM | POA: Diagnosis not present

## 2020-08-24 DIAGNOSIS — Z1231 Encounter for screening mammogram for malignant neoplasm of breast: Secondary | ICD-10-CM | POA: Diagnosis not present

## 2020-08-24 DIAGNOSIS — Z01419 Encounter for gynecological examination (general) (routine) without abnormal findings: Secondary | ICD-10-CM

## 2020-08-24 NOTE — Progress Notes (Signed)
Gynecology Annual Exam  PCP: Charlynne Cousins, MD  Chief Complaint:  Chief Complaint  Patient presents with  . Gynecologic Exam    Annual - no concerns. RM 5    History of Present Illness:Patient is a 65 y.o. G1P0010 presents for annual exam. The patient has no complaints today.   LMP: No LMP recorded. Patient is postmenopausal. No PMB  The patient does perform self breast exams.  There is notable family history of breast or ovarian cancer in her family.  The patient wears seatbelts: yes.   The patient has regular exercise: not asked.    The patient denies current symptoms of depression.     Review of Systems: Review of Systems  Constitutional: Negative.  Negative for chills and fever.  HENT: Negative for congestion.   Respiratory: Negative for cough and shortness of breath.   Cardiovascular: Negative for chest pain and palpitations.  Gastrointestinal: Negative.  Negative for abdominal pain, constipation, diarrhea, heartburn, nausea and vomiting.  Genitourinary: Negative.  Negative for dysuria, frequency and urgency.  Skin: Negative for itching and rash.  Neurological: Negative for dizziness and headaches.  Endo/Heme/Allergies: Negative for polydipsia.  Psychiatric/Behavioral: Negative for depression.    Past Medical History:  Patient Active Problem List   Diagnosis Date Noted  . 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 Encounter for screening colonoscopy 04/13/2015  . Obesity (BMI 30-39.9) 10/06/2013  . 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 BREAST EXCISIONAL BIOPSY Left 1990   benign  . CATARACT  EXTRACTION W/PHACO Left 11/19/2016   Procedure: CATARACT EXTRACTION PHACO AND INTRAOCULAR LENS PLACEMENT (IOC);  Surgeon: Estill Cotta, MD;  Location: [facility] ORS;  Service: Ophthalmology;  Laterality: Left;  [place] 00:57 AP% 23.8 CDE 28.01 Fluid pack lot # [id number]  . CATARACT EXTRACTION W/PHACO Right 05/13/2017   Procedure: CATARACT EXTRACTION PHACO AND INTRAOCULAR LENS PLACEMENT ([place])  RIGHT;  Surgeon: Leandrew Koyanagi, MD;  Location: [facility];  Service: Ophthalmology;  Laterality: Right;  . COLONOSCOPY N/A 2008, 2011  . COLONOSCOPY WITH PROPOFOL N/A 05/30/2015   Procedure: COLONOSCOPY WITH PROPOFOL;  Surgeon: Robert Bellow, MD;  Location: [facility] ENDOSCOPY;  Service: Endoscopy;  Laterality: N/A;  . EYE SURGERY Bilateral ~10 years ago   corrective  . HEMORROIDECTOMY  07/30/2006   External hemorrhoid removed.  Marland Kitchen KNEE ARTHROSCOPY WITH MEDIAL MENISECTOMY Left 03/19/2017   Procedure: KNEE ARTHROSCOPY WITH MEDIAL MENISECTOMY, LIMITED SYNOVECTOMY, CHONDROPLASTY, EXCISION OF OSTEOPHYTES;  Surgeon: Thornton Park, MD;  Location: [facility] ORS;  Service: Orthopedics;  Laterality: Left;  . KNEE SURGERY Left ~25 years ago  . LAPAROSCOPIC GASTRIC SLEEVE RESECTION N/A 09/05/2013   Procedure: LAPAROSCOPIC GASTRIC SLEEVE RESECTION and REPAIR OF HIATAL HERNIA, upper endoscopy;  Surgeon: Gayland Curry, MD;  Location: [facility];  Service: General;  Laterality: N/A;  . LIPOSUCTION    . POLYPECTOMY  2008   benign  . VEIN SURGERY Right more than 10 years ago   varicose veins    Gynecologic History:  No LMP recorded. Patient is postmenopausal.  Obstetric History: G1P0010  Family History:  Family History  Problem Relation Age of Onset  . Lymphoma Mother   . Arthritis Father   .  Hyperlipidemia Other   . Hypertension Other   . Obesity Other   . Ulcers Other   . Pancreatic cancer Paternal Grandfather 22  . Breast cancer Neg Hx     Social History:  Social History   Socioeconomic History   . Marital status: Single    Spouse name: Not on file  . Number of children: Not on file  . Years of education: Not on file  . Highest education level: Not on file  Occupational History  . Not on file  Tobacco Use  . Smoking status: Former Smoker    Packs/day: 1.00    Years: 20.00    Pack years: 20.00    Types: Cigarettes    Quit date: 05/26/1988    Years since quitting: 32.2  . Smokeless tobacco: Never Used  Vaping Use  . Vaping Use: Never used  Substance and Sexual Activity  . Alcohol use: Yes    Comment: occasional  . Drug use: No  . Sexual activity: Yes    Birth control/protection: None  Other Topics Concern  . Not on file  Social History Narrative  . Not on file   Social Determinants of Health   Financial Resource Strain: Not on file  Food Insecurity: Not on file  Transportation Needs: Not on file  Physical Activity: Not on file  Stress: Not on file  Social Connections: Not on file  Intimate Partner Violence: Not on file    Allergies:  Allergies  Allergen Reactions  . Latex Itching    LATEX RAST NEGATIVE 1 WEEK AGO PATIENT STATES  . Adhesive [Tape] Rash  . Codeine Rash    Medications: Prior to Admission medications   Medication Sig Start Date End Date Taking? Authorizing Provider  Biotin w/ Vitamins C & E (HAIR/SKIN/NAILS PO) Take 1 tablet by mouth daily.   Yes [provider]  Calcium Citrate-Vitamin D (CALCIUM + D PO) Take 1 tablet by mouth daily.   Yes [provider]  Cyanocobalamin (B-12 SL) Place 1 tablet under the tongue daily.   Yes [provider]  Multiple Vitamin (MULTIVITAMIN WITH MINERALS) TABS tablet Take 1 tablet by mouth daily.   Yes [provider]  naproxen sodium (ANAPROX) 220 MG tablet Take 220-440 mg by mouth 2 (two) times daily as needed (pain).    Yes [provider]  SAXENDA 18 MG/3ML SOPN INJECT 3 MG INTO THE SKIN SEE ADMIN INSTRUCTIONS. Patient not taking: Reported on 08/24/2020 04/06/20    [name], MD    Physical Exam Vitals: Blood pressure 132/86, pulse (!) 57, height 5\' 4"  (1.626 m), weight 189 lb (85.7 kg).  General: NAD HEENT: normocephalic, anicteric Thyroid: no enlargement, no palpable nodules Pulmonary: No increased work of breathing, CTAB Cardiovascular: RRR, distal pulses 2+ Breast: Breast symmetrical, no tenderness, no palpable nodules or masses, no skin or nipple retraction present, no nipple discharge.  No axillary or supraclavicular lymphadenopathy. Abdomen: NABS, soft, non-tender, non-distended.  Umbilicus without lesions.  No hepatomegaly, splenomegaly or masses palpable. No evidence of hernia  Genitourinary:  External: Normal external female genitalia.  Normal urethral meatus, normal Bartholin's and Skene's glands.    Vagina: Normal vaginal mucosa, no evidence of prolapse.    Cervix: Grossly normal in appearance, no bleeding  Uterus: Non-enlarged, mobile, normal contour.  No CMT  Adnexa: ovaries non-enlarged, no adnexal masses  Rectal: deferred  Lymphatic: no evidence of inguinal lymphadenopathy Extremities: no edema, erythema, or tenderness Neurologic: Grossly intact Psychiatric: mood appropriate, affect full  Female chaperone present for pelvic and breast  portions of the physical exam     Assessment: 65 y.o. G1P0010 routine annual exam  Plan: Problem List Items Addressed This Visit   None   Visit Diagnoses    Encounter for gynecological examination without abnormal finding    -  Primary   Breast screening       Screening for malignant neoplasm of cervix       Relevant Orders   Cytology - PAP   Breast cancer screening by mammogram       Relevant Orders   MM 3D SCREEN BREAST BILATERAL      1) Mammogram - recommend yearly screening mammogram.  Mammogram Is up to date  2) STI screening  was notoffered and therefore not obtained  3) ASCCP guidelines and rational discussed.  Follow up pap from last year obtained  4)  Osteoporosis  - per USPTF routine screening DEXA at age 41  5) Routine healthcare maintenance including cholesterol, diabetes screening discussed managed by PCP  6) Return in about 1 year (around 08/24/2021) for annual.    [name], MD [facility], [place] 08/24/2020, 3:19 PM

## 2020-08-24 NOTE — Patient Instructions (Signed)
[facility] [address] [place] [address]  [facility]  [address] [place] [address]  Phone: [phone]  

## 2020-09-03 LAB — CYTOLOGY - PAP
Comment: NEGATIVE
Comment: NEGATIVE
Diagnosis: UNDETERMINED — AB
HPV 16: NEGATIVE
HPV 18 / 45: NEGATIVE
High risk HPV: POSITIVE — AB

## 2020-09-05 NOTE — Progress Notes (Signed)
Needs colposcopy next 2-6 weeks

## 2020-09-28 ENCOUNTER — Other Ambulatory Visit (HOSPITAL_COMMUNITY)
Admission: RE | Admit: 2020-09-28 | Discharge: 2020-09-28 | Disposition: A | Discharge status: Home / Self Care | Payer: Medicare PPO | Source: Ambulatory Visit | Attending: Obstetrics and Gynecology | Admitting: Obstetrics and Gynecology

## 2020-09-28 ENCOUNTER — Other Ambulatory Visit: Payer: Self-pay

## 2020-09-28 ENCOUNTER — Ambulatory Visit (INDEPENDENT_AMBULATORY_CARE_PROVIDER_SITE_OTHER): Payer: Medicare PPO | Admitting: Obstetrics and Gynecology

## 2020-09-28 ENCOUNTER — Encounter: Payer: Self-pay | Admitting: Obstetrics and Gynecology

## 2020-09-28 VITALS — BP 146/92 | Wt 191.0 lb

## 2020-09-28 DIAGNOSIS — R87610 Atypical squamous cells of undetermined significance on cytologic smear of cervix (ASC-US): Secondary | ICD-10-CM

## 2020-09-28 DIAGNOSIS — R87810 Cervical high risk human papillomavirus (HPV) DNA test positive: Secondary | ICD-10-CM

## 2020-09-28 DIAGNOSIS — R87619 Unspecified abnormal cytological findings in specimens from cervix uteri: Secondary | ICD-10-CM

## 2020-09-28 NOTE — Progress Notes (Signed)
   GYNECOLOGY CLINIC COLPOSCOPY PROCEDURE NOTE  65 y.o. G1P0010 here for colposcopy for ASCUS with POSITIVE high risk HPV  pap smear on 08/24/2020. Discussed underlying role for HPV infection in the development of cervical dysplasia, its natural history and progression/regression, need for surveillance.  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 Patient is postmenopausal. Smoking status:  reports that she quit smoking about 32 years ago. Her smoking use included cigarettes. She has a 20.00 pack-year smoking history. She has never used smokeless tobacco. Contraception: post menopausal status Future fertility desired:  No  Patient given informed consent, signed copy in the chart, time out was performed.  The patient was position in dorsal lithotomy position. Speculum was placed the cervix was visualized.   After application of acetic acid colposcopic inspection of the cervix was undertaken.   Colposcopy adequate, full visualization of transformation zone: Yes no visible lesions; corresponding biopsies obtained.   ECC specimen obtained:  Yes  All specimens were labeled and sent to pathology.   Patient was given post procedure instructions.  Will follow up pathology and manage accordingly.  Routine preventative health maintenance measures emphasized.  OBGyn Exam  [name], MD, [facility], [facility]

## 2020-10-02 LAB — SURGICAL PATHOLOGY

## 2021-02-22 ENCOUNTER — Encounter: Payer: Self-pay | Admitting: Obstetrics and Gynecology

## 2021-04-05 ENCOUNTER — Other Ambulatory Visit: Payer: Self-pay | Admitting: Obstetrics and Gynecology

## 2021-04-22 ENCOUNTER — Encounter: Payer: Self-pay | Admitting: Emergency Medicine

## 2021-04-22 ENCOUNTER — Emergency Department
Admission: EM | Admit: 2021-04-22 | Discharge: 2021-04-22 | Disposition: A | Discharge status: Home / Self Care | Payer: Medicare PPO | Attending: Emergency Medicine | Admitting: Emergency Medicine

## 2021-04-22 DIAGNOSIS — R040 Epistaxis: Secondary | ICD-10-CM | POA: Diagnosis present

## 2021-04-22 DIAGNOSIS — Z79899 Other long term (current) drug therapy: Secondary | ICD-10-CM | POA: Insufficient documentation

## 2021-04-22 DIAGNOSIS — Z91040 Latex allergy status: Secondary | ICD-10-CM | POA: Diagnosis not present

## 2021-04-22 DIAGNOSIS — Z96652 Presence of left artificial knee joint: Secondary | ICD-10-CM | POA: Insufficient documentation

## 2021-04-22 DIAGNOSIS — I10 Essential (primary) hypertension: Secondary | ICD-10-CM | POA: Insufficient documentation

## 2021-04-22 DIAGNOSIS — Z87891 Personal history of nicotine dependence: Secondary | ICD-10-CM | POA: Insufficient documentation

## 2021-04-22 DIAGNOSIS — J45909 Unspecified asthma, uncomplicated: Secondary | ICD-10-CM | POA: Insufficient documentation

## 2021-04-22 LAB — CBC
HCT: 45.9 % (ref 36.0–46.0)
Hemoglobin: 15 g/dL (ref 12.0–15.0)
MCH: 27.8 pg (ref 26.0–34.0)
MCHC: 32.7 g/dL (ref 30.0–36.0)
MCV: 85.2 fL (ref 80.0–100.0)
Platelets: 298 10*3/uL (ref 150–400)
RBC: 5.39 MIL/uL — ABNORMAL HIGH (ref 3.87–5.11)
RDW: 12.9 % (ref 11.5–15.5)
WBC: 9.6 10*3/uL (ref 4.0–10.5)
nRBC: 0 % (ref 0.0–0.2)

## 2021-04-22 LAB — COMPREHENSIVE METABOLIC PANEL
ALT: 21 U/L (ref 0–44)
AST: 23 U/L (ref 15–41)
Albumin: 4.3 g/dL (ref 3.5–5.0)
Alkaline Phosphatase: 69 U/L (ref 38–126)
Anion gap: 7 (ref 5–15)
BUN: 13 mg/dL (ref 8–23)
CO2: 25 mmol/L (ref 22–32)
Calcium: 9.2 mg/dL (ref 8.9–10.3)
Chloride: 103 mmol/L (ref 98–111)
Creatinine, Ser: 0.61 mg/dL (ref 0.44–1.00)
GFR, Estimated: >60 mL/min (ref >60)
Glucose, Bld: 100 mg/dL — ABNORMAL HIGH (ref 70–99)
Potassium: 3.8 mmol/L (ref 3.5–5.1)
Sodium: 135 mmol/L (ref 135–145)
Total Bilirubin: 1 mg/dL (ref 0.3–1.2)
Total Protein: 7.9 g/dL (ref 6.5–8.1)

## 2021-04-22 MED ORDER — OXYMETAZOLINE HCL 0.05 % NA SOLN: 1.0000 | Freq: Once | NASAL | Status: DC

## 2021-04-22 NOTE — ED Provider Notes (Signed)
Emergency Medicine Provider Triage Evaluation Note  Charlotte Moses, a 65 y.o. female  was evaluated in triage.  Pt complains of nosebleed.  Patient reports onset about an hour prior to arrival.  She denies any blood thinners at this time but does endorse taking an aai berry supplement.  She denies any trauma to the nose or face.  She also denies any fevers, chills, sweats, chest pain, shortness of breath..  Review of Systems  Positive: nosebleed Negative: NVD  Physical Exam  There were no vitals taken for this visit. Gen:   Awake, no distress  NAD Resp:  Normal effort CTA MSK:   Moves extremities without difficulty  Other:  ENT: no active bleeding.   Medical Decision Making  Medically screening exam initiated at 1:02 PM.  Appropriate orders placed.  OLEAN SANGSTER was informed that the remainder of the evaluation will be completed by another provider, this initial triage assessment does not replace that evaluation, and the importance of remaining in the ED until their evaluation is complete.  ED evaluation of acute nosebleed 1 hour prior to arrival patient without active stop the bleeding until presented to the ED.   Melvenia Needles, PA-C 04/22/21 1305    Rada Hay, MD 04/22/21 [phone]

## 2021-04-22 NOTE — ED Provider Notes (Signed)
[facility] Emergency Department Provider Note ____________________________________________   Event Date/Time   First MD Initiated Contact with Patient 04/22/21 1342     (approximate)  I have reviewed the triage vital signs and the nursing notes.  HISTORY  Chief Complaint Epistaxis and Hypertension   HPI Charlotte Moses is a 65 y.o. femalewho presents to the ED for evaluation of epistaxis.   Chart review indicates minimal medical hx. No AC.   Patient presents to the ED for evaluation of right-sided epistaxis and associated hypertension.  There is no medications for her blood pressure.  She reports that she is s/p bariatric surgery, but is been putting on some additional weight recently and so bought a new dietary supplement that she found on [employer].   She reports that she has been taking his medication for couple days now.  She reports developing atraumatic right-sided epistaxis today,.  She reports passage of clots.  She was having difficulty controlling the bleeding so she presents to the ED for evaluation.  Denies syncopal episodes, trauma or falls, chest pain, shortness of breath or other bleeding.  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    Past Surgical History:  Procedure Laterality Date   BREAST EXCISIONAL BIOPSY Left 1990   benign   CATARACT EXTRACTION W/PHACO Left 11/19/2016   Procedure: CATARACT EXTRACTION PHACO AND INTRAOCULAR LENS PLACEMENT ([place]);   Surgeon: Estill Cotta, MD;  Location: [facility] ORS;  Service: Ophthalmology;  Laterality: Left;  [place] 00:57 AP% 23.8 CDE 28.01 Fluid pack lot # [id number]   CATARACT EXTRACTION W/PHACO Right 05/13/2017   Procedure: CATARACT EXTRACTION PHACO AND INTRAOCULAR LENS PLACEMENT ([place])  RIGHT;  Surgeon: Leandrew Koyanagi, MD;  Location: [place];  Service: Ophthalmology;  Laterality: Right;   COLONOSCOPY N/A 2008, 2011   COLONOSCOPY WITH PROPOFOL N/A 05/30/2015   Procedure: COLONOSCOPY WITH PROPOFOL;  Surgeon: Robert Bellow, MD;  Location: [facility] ENDOSCOPY;  Service: Endoscopy;  Laterality: N/A;   EYE SURGERY Bilateral ~10 years ago   corrective   HEMORROIDECTOMY  07/30/2006   External hemorrhoid removed.   KNEE ARTHROSCOPY WITH MEDIAL MENISECTOMY Left 03/19/2017   Procedure: KNEE ARTHROSCOPY WITH MEDIAL MENISECTOMY, LIMITED SYNOVECTOMY, CHONDROPLASTY, EXCISION OF OSTEOPHYTES;  Surgeon: Thornton Park, MD;  Location: [facility] ORS;  Service: Orthopedics;  Laterality: Left;   KNEE SURGERY Left ~25 years ago   [place] N/A 09/05/2013   Procedure: LAPAROSCOPIC GASTRIC SLEEVE RESECTION and REPAIR OF HIATAL HERNIA, upper endoscopy;  Surgeon: Gayland Curry, MD;  Location: [facility];  Service: General;  Laterality: N/A;   LIPOSUCTION     POLYPECTOMY  2008   benign   VEIN SURGERY Right more than 10 years ago   varicose veins    Prior to Admission medications   Medication Sig Start Date End Date Taking? Authorizing Provider  Biotin w/ Vitamins C & E (HAIR/SKIN/NAILS PO) Take 1 tablet by mouth daily.    [provider]  Calcium Citrate-Vitamin D (CALCIUM + D PO) Take 1 tablet by mouth daily.    [provider]  Cyanocobalamin (B-12 SL) Place 1 tablet under the tongue daily.    [provider]  Multiple Vitamin (MULTIVITAMIN WITH MINERALS) TABS tablet Take 1 tablet by mouth daily.    [provider]  naproxen sodium (ANAPROX) 220 MG  tablet Take 220-440 mg by mouth 2 (two) times daily as needed (pain).     [provider]  SAXENDA 18 MG/3ML SOPN INJECT 3 MG INTO THE SKIN SEE ADMIN INSTRUCTIONS. Patient not taking: Reported on 08/24/2020 04/06/20   [name], MD    Allergies Latex, Adhesive [tape], and Codeine  Family History  Problem Relation Age of Onset   Lymphoma Mother    Arthritis Father    Hyperlipidemia Other    Hypertension Other    Obesity Other    Ulcers Other    Pancreatic cancer Paternal Grandfather 31   Breast cancer Neg Hx     Social History Social History   Tobacco Use   Smoking status: Former    Packs/day: 1.00    Years: 20.00    Pack years: 20.00    Types: Cigarettes    Quit date: 05/26/1988    Years since quitting: 32.9   Smokeless tobacco: Never  Vaping Use   Vaping Use: Never used  Substance Use Topics   Alcohol use: Yes    Comment: occasional   Drug use: No    Review of Systems  Constitutional: No fever/chills Eyes: No visual changes. ENT: No sore throat. Positive for epistaxis right-sided Cardiovascular: Denies chest pain. Respiratory: Denies shortness of breath. Gastrointestinal: No abdominal pain.  No nausea, no vomiting.  No diarrhea.  No constipation. Genitourinary: Negative for dysuria. Musculoskeletal: Negative for back pain. Skin: Negative for rash. Neurological: Negative for headaches, focal weakness or numbness. ____________________________________________   PHYSICAL EXAM:  VITAL SIGNS: Vitals:   04/22/21 1306 04/22/21 1401  BP: (!) 209/110 (!) 160/108  Pulse: 67   Resp: 20   Temp: 98.1 F (36.7 C)   SpO2: 98%      Constitutional: Alert and oriented. Well appearing and in no acute distress. Nasal clamp in place when I evaluate the patient.  This was placed in triage and bleeding since stopped. Eyes: Conjunctivae are normal. PERRL. EOMI. Head: Atraumatic. No nasal septal hematoma.  No evidence of active bleeding. Nose: No  congestion/rhinnorhea. Mouth/Throat: Mucous membranes are moist.  Oropharynx non-erythematous. No bleeding posteriorly. Neck: No stridor. No cervical spine tenderness to palpation. Cardiovascular: Normal rate, regular rhythm. Grossly normal heart sounds.  Good peripheral circulation. Respiratory: Normal respiratory effort.  No retractions. Lungs CTAB. Gastrointestinal: Soft , nondistended, nontender to palpation. No CVA tenderness. Musculoskeletal: No lower extremity tenderness nor edema.  No joint effusions. No signs of acute trauma. Neurologic:  Normal speech and language. No gross focal neurologic deficits are appreciated. No gait instability noted. Skin:  Skin is warm, dry and intact. No rash noted. Psychiatric: Mood and affect are normal. Speech and behavior are normal.  ____________________________________________   LABS (all labs ordered are listed, but only abnormal results are displayed)  Labs Reviewed  CBC - Abnormal; Notable for the following components:      Result Value   RBC 5.39 (*)    All other components within normal limits  COMPREHENSIVE METABOLIC PANEL - Abnormal; Notable for the following components:   Glucose, Bld 100 (*)    All other components within  normal limits   ____________________________________________  12 Lead EKG   ____________________________________________  RADIOLOGY  ED MD interpretation:    Official radiology report(s): No results found.  ____________________________________________   PROCEDURES and INTERVENTIONS  Procedure(s) performed (including Critical Care):  Procedures  Medications  oxymetazoline (AFRIN) 0.05 % nasal spray 1 spray (1 spray Each Nare Not Given 04/22/21 1405)    ____________________________________________   MDM / ED COURSE   65 year old woman not on anticoagulation presents to the ED after atraumatic anterior right-sided epistaxis, resolving with nasal clamping, and amenable to outpatient  management.  No active bleeding.  Hypertension improves as bleeding resolves.  No evidence of endorgan damage.  No evidence of neurologic or vascular deficits or other bleeding.  Educate the patient on picking up Afrin as an outpatient, provided a nose clamp and return precautions for the ED.     ____________________________________________   FINAL CLINICAL IMPRESSION(S) / ED DIAGNOSES  Final diagnoses:  Epistaxis  Primary hypertension     ED Discharge Orders     None        Lunell Robart   Note:  This document was prepared using Dragon voice recognition software and may include unintentional dictation errors.    Vladimir Crofts, MD 04/22/21 1414

## 2021-04-22 NOTE — Discharge Instructions (Addendum)
Get over the counter Afrin.  Stop taking that supplement  Please take Tylenol and ibuprofen/Advil for your pain.  It is safe to take them together, or to alternate them every few hours.  Take up to 1000mg  of Tylenol at a time, up to 4 times per day.  Do not take more than 4000 mg of Tylenol in 24 hours.  For ibuprofen, take 400-600 mg, 4-5 times per day.

## 2021-04-22 NOTE — ED Notes (Signed)
Pt reports started taking dietary supplements a few days ago and made her have HTN, was side effect listed. No hx of HTN.  Nose started bleeding at 1130 today. Clamp in place at this time. No bleeding at this time

## 2021-04-22 NOTE — ED Triage Notes (Signed)
Pt in with nose bleed since 1200 today. Hypertensive in triage - 209/110. No thinners, but states she started a new Acai dietary supplement 2 days ago. Denies any vision changes or HA. Nose clamp applied in triage

## 2021-09-19 ENCOUNTER — Telehealth: Payer: Self-pay | Admitting: Family Medicine

## 2021-09-19 NOTE — Telephone Encounter (Signed)
Left message for pt to call office to make appt for annual in May 2023 ?

## 2021-10-28 ENCOUNTER — Encounter: Payer: Self-pay | Admitting: Family Medicine

## 2021-10-28 ENCOUNTER — Other Ambulatory Visit (HOSPITAL_COMMUNITY)
Admission: RE | Admit: 2021-10-28 | Discharge: 2021-10-28 | Disposition: A | Discharge status: Home / Self Care | Payer: Medicare PPO | Source: Ambulatory Visit | Attending: Family Medicine | Admitting: Family Medicine

## 2021-10-28 ENCOUNTER — Ambulatory Visit (INDEPENDENT_AMBULATORY_CARE_PROVIDER_SITE_OTHER): Payer: Medicare PPO | Admitting: Family Medicine

## 2021-10-28 VITALS — BP 120/70 | Temp 97.7°F | Ht 64.0 in | Wt 193.4 lb

## 2021-10-28 DIAGNOSIS — R87810 Cervical high risk human papillomavirus (HPV) DNA test positive: Secondary | ICD-10-CM | POA: Diagnosis not present

## 2021-10-28 DIAGNOSIS — Z1151 Encounter for screening for human papillomavirus (HPV): Secondary | ICD-10-CM | POA: Insufficient documentation

## 2021-10-28 DIAGNOSIS — Z01419 Encounter for gynecological examination (general) (routine) without abnormal findings: Secondary | ICD-10-CM | POA: Insufficient documentation

## 2021-10-28 DIAGNOSIS — R87610 Atypical squamous cells of undetermined significance on cytologic smear of cervix (ASC-US): Secondary | ICD-10-CM | POA: Insufficient documentation

## 2021-10-28 NOTE — Progress Notes (Signed)
GYNECOLOGY ANNUAL PREVENTATIVE CARE ENCOUNTER NOTE  Subjective:   Charlotte Moses is a 66 y.o. G16P0010 female here for a routine annual gynecologic exam.  Current complaints: None  Chief Complaint  Patient presents with   Annual Exam       Performs self breast exam Recently saw PCP and had blood work Still working and travels which effects diet and exercise Had DEXA "years ago"   Denies abnormal vaginal bleeding, discharge, pelvic pain, problems with intercourse or other gynecologic concerns.    Gynecologic History No LMP recorded. Patient is postmenopausal. Contraception: post menopausal status Last Pap: 2022. Results were: abnormal-- ASCUS HPV POS, after LEEP in 2021 Last mammogram: Sept 2022. Results were: normal  Health Maintenance Due  Topic Date Due   FOOT EXAM  Never done   OPHTHALMOLOGY EXAM  Never done   Hepatitis C Screening  Never done   TETANUS/TDAP  Never done   COVID-19 Vaccine (2 - Pfizer series) 04/10/1999   HEMOGLOBIN A1C  09/02/2017   Pneumonia Vaccine 53+ Years old (1 - PCV) Never done   DEXA SCAN  02/21/2020   Zoster Vaccines- Shingrix (2 of 2) 02/17/2021    The following portions of the patient's history were reviewed and updated as appropriate: allergies, current medications, past family history, past medical history, past social history, past surgical history and problem list.  Review of Systems Pertinent items are noted in HPI.   Objective:  BP 120/70   Temp 97.7 F (36.5 C) (Oral)   Ht '5\' 4"'$  (1.626 m)   Wt 193 lb 6.4 oz (87.7 kg)   BMI 33.20 kg/m  CONSTITUTIONAL: Well-developed, well-nourished female in no acute distress.  HENT:  Normocephalic, atraumatic, External right and left ear normal. Oropharynx is clear and moist EYES:  No scleral icterus.  NECK: Normal range of motion, supple, no masses.  Normal thyroid.  SKIN: Skin is warm and dry. No rash noted. Not diaphoretic. No erythema. No pallor. NEUROLOGIC: Alert and oriented to person,  place, and time. Normal reflexes, muscle tone coordination. No cranial nerve deficit noted. PSYCHIATRIC: Normal mood and affect. Normal behavior. Normal judgment and thought content. CARDIOVASCULAR: Normal heart rate noted, regular rhythm. 2+ distal pulses. RESPIRATORY: Effort and breath sounds normal, no problems with respiration noted. BREASTS: Symmetric in size. No masses, skin changes, nipple drainage, or lymphadenopathy. ABDOMEN: Soft,  no distention noted.  No tenderness, rebound or guarding.  PELVIC: Normal appearing external genitalia; Mildly atrophic vaginal mucosa and cervix. No abnormal discharge noted.  Pap smear obtained.  Normal uterine size, no other palpable masses, no uterine or adnexal tenderness. MUSCULOSKELETAL: Normal range of motion.     Assessment and Plan:  1) Annual gynecologic examination with pap smear:  Will follow up results of pap smear and manage accordingly.  Routine preventative health maintenance measures emphasized. Reviewed perimenopausal symptoms and management.    1. Atypical squamous cells of undetermined significance on cytologic smear of cervix (ASC-US) 2022-- ASCUS HPV pos, LEEP with CIN1 - Cytology - PAP  2. Well woman exam with routine gynecological exam - DG Bone Density; Future - MM 3D SCREEN BREAST BILATERAL; Future - Cytology - PAP - Discussed diet and exercise for risk reduction - Reviewed recommendation for regular care with PCP which she does have   Please refer to After Visit Summary for other counseling recommendations.   Return in about 1 year (around 10/29/2022) for Yearly wellness exam.  Caren Macadam, MD, MPH, ABFM Attending [place] for [facility]

## 2021-10-30 LAB — CYTOLOGY - PAP
Adequacy: ABSENT
Comment: NEGATIVE
Comment: NEGATIVE
Comment: NEGATIVE
HPV 16: NEGATIVE
HPV 18 / 45: NEGATIVE
High risk HPV: POSITIVE — AB

## 2021-10-31 ENCOUNTER — Encounter: Payer: Self-pay | Admitting: Family Medicine

## 2021-11-01 NOTE — Progress Notes (Signed)
Called pt to discuss results, schedule colpo. Pt states she is waiting on Dr Ernestina Patches response from her message, she doesnot think doing a second colpo will change anything.Pt says its a waste of time and money.

## 2021-11-04 ENCOUNTER — Telehealth: Payer: Self-pay

## 2021-11-04 ENCOUNTER — Encounter: Payer: Self-pay | Admitting: Family Medicine

## 2021-11-04 ENCOUNTER — Other Ambulatory Visit: Payer: Self-pay

## 2021-11-04 DIAGNOSIS — Z86010 Personal history of colonic polyps: Secondary | ICD-10-CM

## 2021-11-04 MED ORDER — PEG 3350-KCL-NA BICARB-NACL 420 G PO SOLR: 4000.0000 mL | Freq: Once | ORAL | 0 refills | Status: AC

## 2021-11-04 NOTE — Telephone Encounter (Signed)
Gastroenterology Pre-Procedure Review  Request Date: 11/29/21 Requesting Physician: Dr. Vicente Males  PATIENT REVIEW QUESTIONS: The patient responded to the following health history questions as indicated:    1. Are you having any GI issues? no 2. Do you have a personal history of Polyps? Yes last colonoscopy performed by Dr. Bary Castilla 05/30/2015 noted 28m polyp 3. Do you have a family history of Colon Cancer or Polyps? no 4. Diabetes Mellitus? no 5. Joint replacements in the past 12 months?no 6. Major health problems in the past 3 months?no 7. Any artificial heart valves, MVP, or defibrillator?no    MEDICATIONS & ALLERGIES:    Patient reports the following regarding taking any anticoagulation/antiplatelet therapy:   Plavix, Coumadin, Eliquis, Xarelto, Lovenox, Pradaxa, Brilinta, or Effient? no Aspirin? no  Patient confirms/reports the following medications:  Current Outpatient Medications  Medication Sig Dispense Refill   Biotin w/ Vitamins C & E (HAIR/SKIN/NAILS PO) Take 1 tablet by mouth daily.     Calcium Citrate-Vitamin D (CALCIUM + D PO) Take 1 tablet by mouth daily.     Cyanocobalamin (B-12 SL) Place 1 tablet under the tongue daily.     losartan (COZAAR) 25 MG tablet Take 25 mg by mouth daily.     Multiple Vitamin (MULTIVITAMIN WITH MINERALS) TABS tablet Take 1 tablet by mouth daily.     naproxen sodium (ANAPROX) 220 MG tablet Take 220-440 mg by mouth 2 (two) times daily as needed (pain).      No current facility-administered medications for this visit.    Patient confirms/reports the following allergies:  Allergies  Allergen Reactions   Latex Itching    LATEX RAST NEGATIVE 1 WEEK AGO PATIENT STATES   Adhesive [Tape] Rash   Codeine Rash   Silicone Rash    No orders of the defined types were placed in this encounter.   AUTHORIZATION INFORMATION Primary Insurance: 1D#: Group #:  Secondary Insurance: 1D#: Group #:  SCHEDULE INFORMATION: Date:  11/29/21 Time: Location: [facility]

## 2021-11-04 NOTE — Telephone Encounter (Signed)
Pt contacted office to reschedule her colonoscopy procedure 11/29/21 to 12/13/21 still with Dr. Vicente Males at [facility]. [place] in Endo has been notified of date change. Instructions updated to reflect date change.  Thanks,  [place], [place]

## 2021-11-06 NOTE — Telephone Encounter (Signed)
Please advise 

## 2021-12-12 ENCOUNTER — Encounter: Payer: Self-pay | Admitting: Gastroenterology

## 2021-12-13 ENCOUNTER — Encounter
Admission: RE | Disposition: A | Discharge status: Home / Self Care | Payer: Self-pay | Source: Home / Self Care | Attending: Gastroenterology

## 2021-12-13 ENCOUNTER — Ambulatory Visit: Payer: Medicare PPO | Admitting: Anesthesiology

## 2021-12-13 ENCOUNTER — Encounter: Payer: Self-pay | Admitting: Gastroenterology

## 2021-12-13 ENCOUNTER — Ambulatory Visit
Admission: RE | Admit: 2021-12-13 | Discharge: 2021-12-13 | Disposition: A | Discharge status: Home / Self Care | Payer: Medicare PPO | Attending: Gastroenterology | Admitting: Gastroenterology

## 2021-12-13 DIAGNOSIS — D122 Benign neoplasm of ascending colon: Secondary | ICD-10-CM | POA: Insufficient documentation

## 2021-12-13 DIAGNOSIS — E119 Type 2 diabetes mellitus without complications: Secondary | ICD-10-CM | POA: Diagnosis not present

## 2021-12-13 DIAGNOSIS — Z86010 Personal history of colonic polyps: Secondary | ICD-10-CM

## 2021-12-13 DIAGNOSIS — Z87891 Personal history of nicotine dependence: Secondary | ICD-10-CM | POA: Diagnosis not present

## 2021-12-13 DIAGNOSIS — D126 Benign neoplasm of colon, unspecified: Secondary | ICD-10-CM | POA: Diagnosis not present

## 2021-12-13 DIAGNOSIS — Z1211 Encounter for screening for malignant neoplasm of colon: Secondary | ICD-10-CM | POA: Insufficient documentation

## 2021-12-13 HISTORY — PX: COLONOSCOPY WITH PROPOFOL: SHX5780

## 2021-12-13 LAB — GLUCOSE, CAPILLARY: Glucose-Capillary: 83 mg/dL (ref 70–99)

## 2021-12-13 SURGERY — COLONOSCOPY WITH PROPOFOL
Anesthesia: General

## 2021-12-13 MED ORDER — PROPOFOL 10 MG/ML IV BOLUS
INTRAVENOUS | Status: DC | PRN
  Administered 2021-12-13 (×3): 20 mg via INTRAVENOUS
  Administered 2021-12-13: 140 mg via INTRAVENOUS

## 2021-12-13 MED ORDER — PROPOFOL 1000 MG/100ML IV EMUL
INTRAVENOUS | Status: AC
  Filled 2021-12-13: qty 100

## 2021-12-13 MED ORDER — LIDOCAINE HCL (PF) 2 % IJ SOLN
INTRAMUSCULAR | Status: AC
  Filled 2021-12-13: qty 5

## 2021-12-13 MED ORDER — SODIUM CHLORIDE 0.9 % IV SOLN
INTRAVENOUS | Status: DC
  Administered 2021-12-13: 500 mL via INTRAVENOUS

## 2021-12-13 MED ORDER — PROPOFOL 500 MG/50ML IV EMUL
INTRAVENOUS | Status: DC | PRN
  Administered 2021-12-13: 150 ug/kg/min via INTRAVENOUS

## 2021-12-13 MED ORDER — LIDOCAINE HCL (CARDIAC) PF 100 MG/5ML IV SOSY
PREFILLED_SYRINGE | INTRAVENOUS | Status: DC | PRN
  Administered 2021-12-13: 60 mg via INTRAVENOUS

## 2021-12-13 MED ORDER — GLYCOPYRROLATE 0.2 MG/ML IJ SOLN
INTRAMUSCULAR | Status: AC
  Filled 2021-12-13: qty 1

## 2021-12-13 MED ORDER — GLYCOPYRROLATE 0.2 MG/ML IJ SOLN
INTRAMUSCULAR | Status: DC | PRN
  Administered 2021-12-13: .2 mg via INTRAVENOUS

## 2021-12-13 NOTE — Anesthesia Postprocedure Evaluation (Signed)
Anesthesia Post Note  Patient: Charlotte Moses  Procedure(s) Performed: COLONOSCOPY WITH PROPOFOL  Patient location during evaluation: Endoscopy Anesthesia Type: General Level of consciousness: awake and alert Pain management: pain level controlled Vital Signs Assessment: post-procedure vital signs reviewed and stable Respiratory status: spontaneous breathing, nonlabored ventilation, respiratory function stable and patient connected to nasal cannula oxygen Cardiovascular status: blood pressure returned to baseline and stable Postop Assessment: no apparent nausea or vomiting Anesthetic complications: no   No notable events documented.   Last Vitals:  Vitals:   12/13/21 0901 12/13/21 0911  BP: 128/81 (!) 166/104  Pulse: 66 66  Resp: 20   Temp: (!) 35.9 C   SpO2: 100% 100%    Last Pain:  Vitals:   12/13/21 0911  TempSrc:   PainSc: 0-No pain                 Precious Haws Rayford Williamsen

## 2021-12-13 NOTE — H&P (Signed)
Charlotte Bellows, MD [address] [place], [place], [place], [address] [id number], [place], [place], [place], [address] Phone: [phone]  Fax: [phone]  Primary Care Physician:  Charlotte Cousins, MD   Pre-Procedure History & Physical: HPI:  Charlotte Moses is a 66 y.o. female is here for an colonoscopy.   Past Medical History:  Diagnosis Date   Arthritis    knees   Asthma    altitude induced   Diabetes mellitus without complication ([place])    PT HAD BARIATRIC SURGERY AND DIABETES NOW RESOLVED   Hyperlipidemia    Obesity    Personal history of colonic polyps     Past Surgical History:  Procedure Laterality Date   BREAST EXCISIONAL BIOPSY Left 1990   benign   BREAST SURGERY     CATARACT EXTRACTION W/PHACO Left 11/19/2016   Procedure: CATARACT EXTRACTION PHACO AND INTRAOCULAR LENS PLACEMENT ([place]);  Surgeon: Charlotte Cotta, MD;  Location: [facility] ORS;  Service: Ophthalmology;  Laterality: Left;  [place] 00:57 AP% 23.8 CDE 28.01 Fluid pack lot # [id number]   CATARACT EXTRACTION W/PHACO Right 05/13/2017   Procedure: CATARACT EXTRACTION PHACO AND INTRAOCULAR LENS PLACEMENT ([place])  RIGHT;  Surgeon: Charlotte Koyanagi, MD;  Location: [place];  Service: Ophthalmology;  Laterality: Right;   COLONOSCOPY N/A 2008, 2011   COLONOSCOPY WITH PROPOFOL N/A 05/30/2015   Procedure: COLONOSCOPY WITH PROPOFOL;  Surgeon: Charlotte Bellow, MD;  Location: [facility] END[facility]COPY;  Service: Endoscopy;  Laterality: N/A;   EYE SURGERY Bilateral ~10 years ago   corrective   HEMORROIDECTOMY  07/30/2006   External hemorrhoid removed.   KNEE ARTHROSCOPY WITH MEDIAL MENISECTOMY Left 03/19/2017   Procedure: KNEE ARTHROSCOPY WITH MEDIAL MENISECTOMY, LIMITED SYNOVECTOMY, CHONDROPLASTY, EXCISION OF OSTEOPHYTES;  Surgeon: Charlotte Park, MD;  Location: [facility] ORS;  Service: Orthopedics;  Laterality: Left;   KNEE SURGERY Left ~25 years ago   [place] N/A 09/05/2013   Procedure:  LAPAROSCOPIC GASTRIC SLEEVE RESECTION and REPAIR OF HIATAL HERNIA, upper endoscopy;  Surgeon: Charlotte Curry, MD;  Location: [facility];  Service: General;  Laterality: N/A;   LIPOSUCTION     POLYPECTOMY  2008   benign   VEIN SURGERY Right more than 10 years ago   varicose veins    Prior to Admission medications   Medication Sig Start Date End Date Taking? Authorizing Provider  Biotin w/ Vitamins C & E (HAIR/SKIN/NAILS PO) Take 1 tablet by mouth daily.   Yes [provider]  Calcium Citrate-Vitamin D (CALCIUM + D PO) Take 1 tablet by mouth daily.   Yes [provider]  Cyanocobalamin (B-12 SL) Place 1 tablet under the tongue daily.   Yes [provider]  losartan (COZAAR) 25 MG tablet Take 25 mg by mouth daily. 10/16/21  Yes [provider]  Multiple Vitamin (MULTIVITAMIN WITH MINERALS) TABS tablet Take 1 tablet by mouth daily.   Yes [provider]  naproxen sodium (ANAPROX) 220 MG tablet Take 220-440 mg by mouth 2 (two) times daily as needed (pain).     [provider]    Allergies as of 11/04/2021 - Review Complete 10/28/2021  Allergen Reaction Noted   Latex Itching 12/03/2012   Adhesive [tape] Rash 11/14/2016   Codeine Rash 24/01/7352   Silicone Rash 29/92/4268    Family History  Problem Relation Age of Onset   Lymphoma Mother    Arthritis Father    Hyperlipidemia Other    Hypertension Other    Obesity Other  Ulcers Other    Pancreatic cancer Paternal Grandfather 9   Breast cancer Neg Hx     Social History   Socioeconomic History   Marital status: Single    Spouse name: Not on file   Number of children: Not on file   Years of education: Not on file   Highest education level: Not on file  Occupational History   Not on file  Tobacco Use   Smoking status: Former    Packs/day: 1.00    Years: 20.00    Total pack years: 20.00    Types: Cigarettes    Quit date: 05/26/1988    Years since quitting: 33.5   Smokeless  tobacco: Never  Vaping Use   Vaping Use: Never used  Substance and Sexual Activity   Alcohol use: Yes    Comment: occasional   Drug use: No   Sexual activity: Yes    Birth control/protection: None  Other Topics Concern   Not on file  Social History Narrative   Not on file   Social Determinants of Health   Financial Resource Strain: Not on file  Food Insecurity: Not on file  Transportation Needs: Not on file  Physical Activity: Sufficiently Active (07/15/2017)   Exercise Vital Sign    Days of Exercise per Week: 5 days    Minutes of Exercise per Session: 30 min  Stress: No Stress Concern Present (07/15/2017)   [place]    Feeling of Stress : Not at all  Social Connections: Somewhat Isolated (07/15/2017)   Social Connection and Isolation Panel [NHANES]    Frequency of Communication with Friends and Family: More than three times a week    Frequency of Social Gatherings with Friends and Family: More than three times a week    Attends Religious Services: 1 to 4 times per year    Active Member of [employer] or Organizations: No    Attends Archivist Meetings: Never    Marital Status: Never married  Intimate Partner Violence: Not At Risk (07/15/2017)   Humiliation, Afraid, Rape, and Kick questionnaire    Fear of Current or Ex-Partner: No    Emotionally Abused: No    Physically Abused: No    Sexually Abused: No    Review of Systems: See HPI, otherwise negative ROS  Physical Exam: BP (!) 154/101   Pulse (!) 58   Temp (!) 96.3 F (35.7 C) (Temporal)   Resp 18   Ht '5\' 4"'$  (1.626 m)   Wt 84.5 kg   SpO2 99%   BMI 31.99 kg/m  General:   Alert,  pleasant and cooperative in NAD Head:  Normocephalic and atraumatic. Neck:  Supple; no masses or thyromegaly. Lungs:  Clear throughout to auscultation, normal respiratory effort.    Heart:  +S1, +S2, Regular rate and rhythm, No edema. Abdomen:  Soft, nontender and  nondistended. Normal bowel sounds, without guarding, and without rebound.   Neurologic:  Alert and  oriented x4;  grossly normal neurologically.  Impression/Plan: Charlotte Moses is here for an colonoscopy to be performed for surveillance due to prior history of colon polyps   Risks, benefits, limitations, and alternatives regarding  colonoscopy have been reviewed with the patient.  Questions have been answered.  All parties agreeable.   Charlotte Bellows, MD  12/13/2021, 8:19 AM

## 2021-12-13 NOTE — Transfer of Care (Signed)
Immediate Anesthesia Transfer of Care Note  Patient: Charlotte Moses  Procedure(s) Performed: COLONOSCOPY WITH PROPOFOL  Patient Location: PACU and Endoscopy Unit  Anesthesia Type:General  Level of Consciousness: awake  Airway & Oxygen Therapy: Patient Spontanous Breathing  Post-op Assessment: Report given to RN  Post vital signs: stable  Last Vitals:  Vitals Value Taken Time  BP 133/83 12/13/21 0851  Temp    Pulse 70 12/13/21 0851  Resp 9 12/13/21 0851  SpO2 97 % 12/13/21 0851    Last Pain:  Vitals:   12/13/21 0851  TempSrc:   PainSc: Asleep         Complications: No notable events documented.

## 2021-12-13 NOTE — Anesthesia Preprocedure Evaluation (Signed)
Anesthesia Evaluation  Patient identified by MRN, date of birth, ID band Patient awake    Reviewed: Allergy & Precautions, NPO status , Patient's Chart, lab work & pertinent test results  History of Anesthesia Complications Negative for: history of anesthetic complications  Airway Mallampati: III  TM Distance: >3 FB Neck ROM: full    Dental  (+) Chipped   Pulmonary neg shortness of breath, asthma , former smoker,    Pulmonary exam normal        Cardiovascular Exercise Tolerance: Good (-) anginanegative cardio ROS Normal cardiovascular exam     Neuro/Psych negative neurological ROS  negative psych ROS   GI/Hepatic negative GI ROS, Neg liver ROS, neg GERD  ,  Endo/Other  diabetes, Type 2  Renal/GU negative Renal ROS  negative genitourinary   Musculoskeletal   Abdominal   Peds  Hematology negative hematology ROS (+)   Anesthesia Other Findings Past Medical History: No date: Arthritis     Comment:  knees No date: Asthma     Comment:  altitude induced No date: Diabetes mellitus without complication (HCC)     Comment:  PT HAD BARIATRIC SURGERY AND DIABETES NOW RESOLVED No date: Hyperlipidemia No date: Obesity No date: Personal history of colonic polyps  Past Surgical History: 1990: BREAST EXCISIONAL BIOPSY; Left     Comment:  benign No date: BREAST SURGERY 11/19/2016: CATARACT EXTRACTION W/PHACO; Left     Comment:  Procedure: CATARACT EXTRACTION PHACO AND INTRAOCULAR               LENS PLACEMENT (IOC);  Surgeon: Estill Cotta, MD;                Location: [facility];  Service: Ophthalmology;  Laterality:              Left;  [place] 00:57 AP% 23.8 CDE 28.01 Fluid pack lot #               [id number] 05/13/2017: CATARACT EXTRACTION W/PHACO; Right     Comment:  Procedure: CATARACT EXTRACTION PHACO AND INTRAOCULAR               LENS PLACEMENT ([place])  RIGHT;  Surgeon: Leandrew Koyanagi, MD;   Location: [place];  Service:               Ophthalmology;  Laterality: Right; 2008, 2011: COLONOSCOPY; N/A 05/30/2015: COLONOSCOPY WITH PROPOFOL; N/A     Comment:  Procedure: COLONOSCOPY WITH PROPOFOL;  Surgeon: Robert Bellow, MD;  Location: [facility];  Service:               Endoscopy;  Laterality: N/A; ~10 years ago: EYE SURGERY; Bilateral     Comment:  corrective 07/30/2006: HEMORROIDECTOMY     Comment:  External hemorrhoid removed. 03/19/2017: KNEE ARTHROSCOPY WITH MEDIAL MENISECTOMY; Left     Comment:  Procedure: KNEE ARTHROSCOPY WITH MEDIAL MENISECTOMY,               LIMITED SYNOVECTOMY, CHONDROPLASTY, EXCISION OF               OSTEOPHYTES;  Surgeon: Thornton Park, MD;  Location:               [facility];  Service: Orthopedics;  Laterality: Left; ~25 years ago: KNEE SURGERY; Left 09/05/2013: LAPAROSCOPIC GASTRIC SLEEVE RESECTION; N/A  Comment:  Procedure: LAPAROSCOPIC GASTRIC SLEEVE RESECTION and               REPAIR OF HIATAL HERNIA, upper endoscopy;  Surgeon: Gayland Curry, MD;  Location: [facility];  Service: General;                Laterality: N/A; No date: LIPOSUCTION 2008: POLYPECTOMY     Comment:  benign more than 10 years ago: [place]; Right     Comment:  varicose veins  BMI    Body Mass Index: 31.99 kg/m      Reproductive/Obstetrics negative OB ROS                             Anesthesia Physical Anesthesia Plan  ASA: 3  Anesthesia Plan: General   Post-op Pain Management:    Induction: Intravenous  PONV Risk Score and Plan: Propofol infusion and TIVA  Airway Management Planned: Natural Airway and Nasal Cannula  Additional Equipment:   Intra-op Plan:   Post-operative Plan:   Informed Consent: I have reviewed the patients History and Physical, chart, labs and discussed the procedure including the risks, benefits and alternatives for the proposed anesthesia with the  patient or authorized representative who has indicated his/her understanding and acceptance.     Dental Advisory Given  Plan Discussed with: Anesthesiologist, CRNA and Surgeon  Anesthesia Plan Comments: (Patient consented for risks of anesthesia including but not limited to:  - adverse reactions to medications - risk of airway placement if required - damage to eyes, teeth, lips or other oral mucosa - nerve damage due to positioning  - sore throat or hoarseness - Damage to heart, brain, nerves, lungs, other parts of body or loss of life  Patient voiced understanding.)        Anesthesia Quick Evaluation

## 2021-12-13 NOTE — Op Note (Signed)
[facility] Patient Name: Charlotte Moses Procedure Date: 12/13/2021 8:21 AM MRN: [id number] Account #: [id number] Date of Birth: [birth year] Admit Type: Outpatient Age: 66 Room: [facility] ENDO ROOM 4 Gender: Female Note Status: Finalized Instrument Name: Jasper Riling [id number] Procedure:             Colonoscopy Indications:           Surveillance: Personal history of adenomatous polyps                         on last colonoscopy > 5 years ago Providers:             [name] MD, MD Referring MD:          Charlynne Cousins (Referring MD) Medicines:             Monitored Anesthesia Care Complications:         No immediate complications. Procedure:             Pre-Anesthesia Assessment:                        - Prior to the procedure, a History and Physical was                         performed, and patient medications, allergies and                         sensitivities were reviewed. The patient's tolerance                         of previous anesthesia was reviewed.                        - The risks and benefits of the procedure and the                         sedation options and risks were discussed with the                         patient. All questions were answered and informed                         consent was obtained.                        - ASA Grade Assessment: II - A patient with mild                         systemic disease.                        After obtaining informed consent, the colonoscope was                         passed under direct vision. Throughout the procedure,                         the patient's blood pressure, pulse, and oxygen                         saturations  were monitored continuously. The                         Colonoscope was introduced through the anus and                         advanced to the the cecum, identified by the                         appendiceal orifice. The colonoscopy was performed                          with ease. The patient tolerated the procedure well.                         The quality of the bowel preparation was good. Findings:      The perianal and digital rectal examinations were normal.      Two sessile polyps were found in the ascending colon. The polyps were 4       to 5 mm in size. These polyps were removed with a cold snare. Resection       and retrieval were complete.      The exam was otherwise without abnormality on direct and retroflexion       views. Impression:            - Two 4 to 5 mm polyps in the ascending colon, removed                         with a cold snare. Resected and retrieved.                        - The examination was otherwise normal on direct and                         retroflexion views. Recommendation:        - Discharge patient to home (with escort).                        - Resume previous diet.                        - Continue present medications.                        - Await pathology results.                        - Repeat colonoscopy for surveillance based on                         pathology results. Procedure Code(s):     --- Professional ---                        [phone], Colonoscopy, flexible; with removal of                         tumor(s), polyp(s), or other lesion(s) by snare  technique Diagnosis Code(s):     --- Professional ---                        Z86.010, Personal history of colonic polyps                        K63.5, Polyp of colon CPT copyright 2019 [employer]. All rights reserved. The codes documented in this report are preliminary and upon coder review may  be revised to meet current compliance requirements. [name], MD [name] MD, MD 12/13/2021 8:48:18 AM This report has been signed electronically. Number of Addenda: 0 Note Initiated On: 12/13/2021 8:21 AM Scope Withdrawal Time: 0 hours 10 minutes 1 second  Total Procedure Duration: 0 hours 14 minutes 4 seconds   Estimated Blood Loss:  Estimated blood loss: none.      [facility]

## 2021-12-16 ENCOUNTER — Encounter: Payer: Self-pay | Admitting: Gastroenterology

## 2021-12-16 LAB — SURGICAL PATHOLOGY

## 2021-12-23 ENCOUNTER — Ambulatory Visit
Admission: RE | Admit: 2021-12-23 | Discharge: 2021-12-23 | Disposition: A | Discharge status: Home / Self Care | Payer: Medicare PPO | Source: Ambulatory Visit | Attending: Family Medicine | Admitting: Family Medicine

## 2021-12-23 DIAGNOSIS — Z13820 Encounter for screening for osteoporosis: Secondary | ICD-10-CM | POA: Insufficient documentation

## 2021-12-23 DIAGNOSIS — Z780 Asymptomatic menopausal state: Secondary | ICD-10-CM | POA: Insufficient documentation

## 2021-12-23 DIAGNOSIS — Z01419 Encounter for gynecological examination (general) (routine) without abnormal findings: Secondary | ICD-10-CM | POA: Insufficient documentation

## 2021-12-26 ENCOUNTER — Encounter: Payer: Self-pay | Admitting: Emergency Medicine

## 2021-12-26 ENCOUNTER — Emergency Department: Payer: Medicare PPO

## 2021-12-26 ENCOUNTER — Other Ambulatory Visit: Payer: Self-pay

## 2021-12-26 ENCOUNTER — Emergency Department
Admission: EM | Admit: 2021-12-26 | Discharge: 2021-12-26 | Disposition: A | Discharge status: Home / Self Care | Payer: Medicare PPO | Attending: Emergency Medicine | Admitting: Emergency Medicine

## 2021-12-26 DIAGNOSIS — F17200 Nicotine dependence, unspecified, uncomplicated: Secondary | ICD-10-CM | POA: Diagnosis not present

## 2021-12-26 DIAGNOSIS — R110 Nausea: Secondary | ICD-10-CM | POA: Diagnosis not present

## 2021-12-26 DIAGNOSIS — R42 Dizziness and giddiness: Secondary | ICD-10-CM | POA: Diagnosis present

## 2021-12-26 DIAGNOSIS — I10 Essential (primary) hypertension: Secondary | ICD-10-CM | POA: Insufficient documentation

## 2021-12-26 LAB — BASIC METABOLIC PANEL
Anion gap: 8 (ref 5–15)
BUN: 16 mg/dL (ref 8–23)
CO2: 24 mmol/L (ref 22–32)
Calcium: 9.2 mg/dL (ref 8.9–10.3)
Chloride: 106 mmol/L (ref 98–111)
Creatinine, Ser: 0.65 mg/dL (ref 0.44–1.00)
GFR, Estimated: >60 mL/min (ref >60)
Glucose, Bld: 98 mg/dL (ref 70–99)
Potassium: 4 mmol/L (ref 3.5–5.1)
Sodium: 138 mmol/L (ref 135–145)

## 2021-12-26 LAB — URINALYSIS, ROUTINE W REFLEX MICROSCOPIC
Bilirubin Urine: NEGATIVE
Glucose, UA: NEGATIVE mg/dL
Hgb urine dipstick: NEGATIVE
Ketones, ur: NEGATIVE mg/dL
Leukocytes,Ua: NEGATIVE
Nitrite: NEGATIVE
Protein, ur: NEGATIVE mg/dL
Specific Gravity, Urine: 1.001 — ABNORMAL LOW (ref 1.005–1.030)
pH: 7 (ref 5.0–8.0)

## 2021-12-26 LAB — CBC
HCT: 41.8 % (ref 36.0–46.0)
Hemoglobin: 13.4 g/dL (ref 12.0–15.0)
MCH: 27.5 pg (ref 26.0–34.0)
MCHC: 32.1 g/dL (ref 30.0–36.0)
MCV: 85.8 fL (ref 80.0–100.0)
Platelets: 285 10*3/uL (ref 150–400)
RBC: 4.87 MIL/uL (ref 3.87–5.11)
RDW: 12.8 % (ref 11.5–15.5)
WBC: 9.2 10*3/uL (ref 4.0–10.5)
nRBC: 0 % (ref 0.0–0.2)

## 2021-12-26 LAB — TROPONIN I (HIGH SENSITIVITY): Troponin I (High Sensitivity): 3 ng/L (ref <18)

## 2021-12-26 LAB — TSH: TSH: 3.15 u[IU]/mL (ref 0.350–4.500)

## 2021-12-26 MED ORDER — MAGNESIUM SULFATE 2 GM/50ML IV SOLN
2.0000 g | Freq: Once | INTRAVENOUS | Status: AC
  Administered 2021-12-26: 2 g via INTRAVENOUS
  Filled 2021-12-26: qty 50

## 2021-12-26 MED ORDER — LACTATED RINGERS IV BOLUS
1000.0000 mL | Freq: Once | INTRAVENOUS | Status: AC
  Administered 2021-12-26: 1000 mL via INTRAVENOUS

## 2021-12-26 MED ORDER — PROCHLORPERAZINE EDISYLATE 10 MG/2ML IJ SOLN
10.0000 mg | Freq: Once | INTRAMUSCULAR | Status: AC
  Administered 2021-12-26: 10 mg via INTRAVENOUS
  Filled 2021-12-26: qty 2

## 2021-12-26 NOTE — ED Triage Notes (Signed)
Patient to ED via POV for hypertension. Last BP wast 191/140. C/o intermittent dizziness and nauseous. Ambulatory to triage.

## 2021-12-26 NOTE — ED Provider Notes (Signed)
[facility] Provider Note    Event Date/Time   First MD Initiated Contact with Patient 12/26/21 1310     (approximate)   History   Hypertension   HPI  Charlotte Moses is a 66 y.o. female with past medical history of hypertension on lisinopril and remote gastric sleeve surgery who presents for evaluation of high blood pressure she noted this morning that she developed some tingling over her scalp and some nausea.  She denies any headache, earache, sore throat, vision changes, chest pain, cough, shortness of breath, vomiting, back pain, diarrhea, urinary symptoms, rash or any focal extremity weakness numbness or tingling.  She endorses very remote tobacco abuse without any recently.  She endorses THC use but no other other illicit drug use.  She drinks glass of wine occasionally but no significant EtOH use of last couple days.  She states she sometimes will have a cup of Coffee but does not drink significant caffeine.  No other acute symptoms at this time.      Physical Exam  Triage Vital Signs: ED Triage Vitals  Enc Vitals Group     BP 12/26/21 1230 (!) 187/103     Pulse Rate 12/26/21 1230 64     Resp 12/26/21 1230 17     Temp 12/26/21 1230 99 F (37.2 C)     Temp Source 12/26/21 1230 Oral     SpO2 12/26/21 1230 97 %     Weight 12/26/21 1222 190 lb (86.2 kg)     Height 12/26/21 1222 '5\' 4"'$  (1.626 m)     Head Circumference --      Peak Flow --      Pain Score 12/26/21 1222 0     Pain Loc --      Pain Edu? --      Excl. in [place]? --     Most recent vital signs: Vitals:   12/26/21 1230 12/26/21 1315  BP: (!) 187/103 (!) 178/102  Pulse: 64 67  Resp: 17   Temp: 99 F (37.2 C)   SpO2: 97% 100%    General: Awake, no distress.  CV:  Good peripheral perfusion.  Resp:  Normal effort.  Abd:  No distention.  Other:  Cranial nerves II through XII grossly intact.  No nystagmus.  No pronator drift.  No finger dysmetria.  Symmetric 5/5 strength of all  extremities.  Sensation intact to light touch in all extremities.  Unremarkable unassisted gait.  However patient does states she feels unsteady while walking.    ED Results / Procedures / Treatments  Labs (all labs ordered are listed, but only abnormal results are displayed) Labs Reviewed  URINALYSIS, ROUTINE W REFLEX MICROSCOPIC - Abnormal; Notable for the following components:      Result Value   Color, Urine STRAW (*)    APPearance CLEAR (*)    Specific Gravity, Urine 1.001 (*)    All other components within normal limits  BASIC METABOLIC PANEL  CBC  TSH  CBG MONITORING, ED  TROPONIN I (HIGH SENSITIVITY)     EKG  EKG is remarkable for sinus rhythm with a ventricular rate of 64, normal axis, nonspecific ST change in lead III and aVF as well as V3 without clear evidence of other acute ischemia or significant arrhythmia.   RADIOLOGY  CT head on my interpretation without evidence of hemorrhage, ischemia, edema, mass effect or other acute intracranial process.  I also reviewed radiology's interpretation.   PROCEDURES:  Critical Care  performed: No  .1-3 Lead EKG Interpretation  Performed by: Lucrezia Starch, MD Authorized by: Lucrezia Starch, MD     Interpretation: normal     ECG rate assessment: normal     Rhythm: sinus rhythm     Ectopy: none     Conduction: normal    The patient is on the cardiac monitor to evaluate for evidence of arrhythmia and/or significant heart rate changes.   MEDICATIONS ORDERED IN ED: Medications  magnesium sulfate IVPB 2 g 50 mL (2 g Intravenous New Bag/Given 12/26/21 1435)  lactated ringers bolus 1,000 mL (1,000 mLs Intravenous New Bag/Given 12/26/21 1432)  prochlorperazine (COMPAZINE) injection 10 mg (10 mg Intravenous Given 12/26/21 1435)     IMPRESSION / MDM / ASSESSMENT AND PLAN / ED COURSE  I reviewed the triage vital signs and the nursing notes. Patient's presentation is most consistent with acute presentation with potential  threat to life or bodily function.                               Differential diagnosis includes, but is not limited to [facility], CVA, hypertensive emergency versus symptomatic hypertension, ACS, anemia, metabolic derangements possible migraine or other primary headache disorder.  I will also check a TSH for possible hypothyroidism.  EKG is remarkable for sinus rhythm with a ventricular rate of 64, normal axis, nonspecific ST change in lead III and aVF as well as V3 without clear evidence of other acute ischemia or significant arrhythmia.  CT head on my interpretation without evidence of hemorrhage, ischemia, edema, mass effect or other acute intracranial process.  I also reviewed radiology's interpretation.  CBC shows no leukocytosis or acute anemia.  BMP without any significant lecture metabolic derangements.  UA is unremarkable for evidence of infection.  TSH is unremarkable.  Troponin is nonelevated not suggestive of ACS.  Given some Compazine fluids and magnesium on reassessment is feeling much better.  She is able to ambulate with steady gait and feels like she is at baseline.  Low suspicion for acute CVA at this time.  Her repeat blood pressure shows systolics in the 008Q.  Discussed importance of close outpatient PCP follow-up to have her blood pressure rechecked and return for any new or worsening symptoms.  Discharged in stable condition.  Strict precautions advised and discussed.     FINAL CLINICAL IMPRESSION(S) / ED DIAGNOSES   Final diagnoses:  Dizziness  Hypertension, unspecified type  Nausea     Rx / DC Orders   ED Discharge Orders     None        Note:  This document was prepared using Dragon voice recognition software and may include unintentional dictation errors.   Lucrezia Starch, MD 12/26/21 [phone]

## 2021-12-26 NOTE — ED Notes (Signed)
Pt verbalized understanding of discharge instructions and follow-up care instructions. Pt advised if symptoms worsen to return to ED.  

## 2022-03-24 ENCOUNTER — Encounter: Payer: Self-pay | Admitting: Cardiology

## 2022-03-24 ENCOUNTER — Other Ambulatory Visit
Admission: RE | Admit: 2022-03-24 | Discharge: 2022-03-24 | Disposition: A | Discharge status: Home / Self Care | Payer: Medicare PPO | Attending: Cardiology | Admitting: Cardiology

## 2022-03-24 ENCOUNTER — Ambulatory Visit: Payer: Medicare PPO | Attending: Cardiology | Admitting: Cardiology

## 2022-03-24 VITALS — BP 144/86 | HR 58 | Ht 64.0 in | Wt 192.0 lb

## 2022-03-24 DIAGNOSIS — R600 Localized edema: Secondary | ICD-10-CM | POA: Diagnosis not present

## 2022-03-24 DIAGNOSIS — E782 Mixed hyperlipidemia: Secondary | ICD-10-CM

## 2022-03-24 DIAGNOSIS — I10 Essential (primary) hypertension: Secondary | ICD-10-CM

## 2022-03-24 LAB — LIPID PANEL
Cholesterol: 218 mg/dL — ABNORMAL HIGH (ref 0–200)
HDL: 66 mg/dL (ref >40)
LDL Cholesterol: 129 mg/dL — ABNORMAL HIGH (ref 0–99)
Total CHOL/HDL Ratio: 3.3 RATIO
Triglycerides: 116 mg/dL (ref <150)
VLDL: 23 mg/dL (ref 0–40)

## 2022-03-24 MED ORDER — HYDROCHLOROTHIAZIDE 25 MG PO TABS: 12.5000 mg | ORAL_TABLET | Freq: Every day | ORAL | 3 refills | Status: DC

## 2022-03-24 NOTE — Progress Notes (Addendum)
Cardiology Office Note:    Date:  03/24/2022   ID:  Charlotte Moses, DOB 01/22/1956, MRN [id number]  PCP:  Charlynne Cousins, MD (Inactive)   [place] Providers Cardiologist:  Kate Sable, MD     Referring MD: Denton Lank, MD   Chief Complaint  Patient presents with   New Patient (Initial Visit)    HTN, occasional leg swelling, family Hx    History of Present Illness:    Charlotte Moses is a 66 y.o. female with a hx of hypertension, former smoker x20 years who presents due to elevated blood pressures.  Was seen in the ED 12/26/2021 due to lightheadedness.  Blood pressures were elevated at 187/103.  EKG was unremarkable.  Head CT also unremarkable with no acute intracranial abnormalities.  Repeat blood pressures improved with Compazine, IV fluids systolics in the 568L.  Discharge, outpatient follow-up recommended.  Losartan was increased to 100 mg daily.  Blood pressures have still been elevated at home.  She states being worried about her brother who had CABG in his 61s, EF is low at 25%.  She denies chest pain or shortness of breath.  Endorses leg edema usually after being on her feet or sitting for long.  Scheduled to obtain a cardiac CT scan next month in [place].   Past Medical History:  Diagnosis Date   Arthritis    knees   Asthma    altitude induced   Diabetes mellitus without complication ([place])    PT HAD BARIATRIC SURGERY AND DIABETES NOW RESOLVED   Hyperlipidemia    Obesity    Personal history of colonic polyps     Past Surgical History:  Procedure Laterality Date   BREAST EXCISIONAL BIOPSY Left 1990   benign   BREAST SURGERY     CATARACT EXTRACTION W/PHACO Left 11/19/2016   Procedure: CATARACT EXTRACTION PHACO AND INTRAOCULAR LENS PLACEMENT ([place]);  Surgeon: Estill Cotta, MD;  Location: [facility] ORS;  Service: Ophthalmology;  Laterality: Left;  [place] 00:57 AP% 23.8 CDE 28.01 Fluid pack lot # [id number]   CATARACT EXTRACTION W/PHACO Right 05/13/2017    Procedure: CATARACT EXTRACTION PHACO AND INTRAOCULAR LENS PLACEMENT ([place])  RIGHT;  Surgeon: Leandrew Koyanagi, MD;  Location: [place];  Service: Ophthalmology;  Laterality: Right;   COLONOSCOPY N/A 2008, 2011   COLONOSCOPY WITH PROPOFOL N/A 05/30/2015   Procedure: COLONOSCOPY WITH PROPOFOL;  Surgeon: Robert Bellow, MD;  Location: [facility] ENDOSCOPY;  Service: Endoscopy;  Laterality: N/A;   COLONOSCOPY WITH PROPOFOL N/A 12/13/2021   Procedure: COLONOSCOPY WITH PROPOFOL;  Surgeon: [name], MD;  Location: [facility] ENDOSCOPY;  Service: Gastroenterology;  Laterality: N/A;   EYE SURGERY Bilateral ~10 years ago   corrective   HEMORROIDECTOMY  07/30/2006   External hemorrhoid removed.   KNEE ARTHROSCOPY WITH MEDIAL MENISECTOMY Left 03/19/2017   Procedure: KNEE ARTHROSCOPY WITH MEDIAL MENISECTOMY, LIMITED SYNOVECTOMY, CHONDROPLASTY, EXCISION OF OSTEOPHYTES;  Surgeon: Thornton Park, MD;  Location: [facility] ORS;  Service: Orthopedics;  Laterality: Left;   KNEE SURGERY Left ~25 years ago   [place] N/A 09/05/2013   Procedure: LAPAROSCOPIC GASTRIC SLEEVE RESECTION and REPAIR OF HIATAL HERNIA, upper endoscopy;  Surgeon: Gayland Curry, MD;  Location: [facility];  Service: General;  Laterality: N/A;   LIPOSUCTION     POLYPECTOMY  2008   benign   VEIN SURGERY Right more than 10 years ago   varicose veins    Current Medications: Current Meds  Medication Sig   Biotin w/ Vitamins C &  E (HAIR/SKIN/NAILS PO) Take 1 tablet by mouth daily.   Calcium Citrate-Vitamin D (CALCIUM + D PO) Take 1 tablet by mouth daily.   Cyanocobalamin (B-12 SL) Place 1 tablet under the tongue daily.   hydrochlorothiazide (HYDRODIURIL) 25 MG tablet Take 0.5 tablets (12.5 mg total) by mouth daily.   losartan (COZAAR) 100 MG tablet Take 100 mg by mouth daily.   Multiple Vitamin (MULTIVITAMIN WITH MINERALS) TABS tablet Take 1 tablet by mouth daily.   naproxen sodium (ANAPROX) 220 MG tablet Take  220-440 mg by mouth 2 (two) times daily as needed (pain).    [DISCONTINUED] losartan (COZAAR) 25 MG tablet Take 100 mg by mouth daily.     Allergies:   Latex, Adhesive [tape], Codeine, and Silicone   Social History   Socioeconomic History   Marital status: Single    Spouse name: Not on file   Number of children: Not on file   Years of education: Not on file   Highest education level: Not on file  Occupational History   Not on file  Tobacco Use   Smoking status: Former    Packs/day: 1.00    Years: 20.00    Total pack years: 20.00    Types: Cigarettes    Quit date: 05/26/1988    Years since quitting: 33.8   Smokeless tobacco: Never  Vaping Use   Vaping Use: Never used  Substance and Sexual Activity   Alcohol use: Yes    Comment: occasional   Drug use: Yes    Types: Marijuana    Comment: gummies   Sexual activity: Yes    Birth control/protection: None  Other Topics Concern   Not on file  Social History Narrative   Not on file   Social Determinants of Health   Financial Resource Strain: Not on file  Food Insecurity: Not on file  Transportation Needs: Not on file  Physical Activity: Sufficiently Active (07/15/2017)   Exercise Vital Sign    Days of Exercise per Week: 5 days    Minutes of Exercise per Session: 30 min  Stress: No Stress Concern Present (07/15/2017)   [place]    Feeling of Stress : Not at all  Social Connections: Somewhat Isolated (07/15/2017)   Social Connection and Isolation Panel [NHANES]    Frequency of Communication with Friends and Family: More than three times a week    Frequency of Social Gatherings with Friends and Family: More than three times a week    Attends Religious Services: 1 to 4 times per year    Active Member of [employer] or Organizations: No    Attends Music therapist: Never    Marital Status: Never married     Family History: The patient's family  history includes Arthritis in her father; Hyperlipidemia in an other family member; Hypertension in an other family member; Lymphoma in her mother; Obesity in an other family member; Pancreatic cancer (age of onset: 71) in her paternal grandfather; Ulcers in an other family member. There is no history of Breast cancer.  ROS:   Please see the history of present illness.     All other systems reviewed and are negative.  EKGs/Labs/Other Studies Reviewed:    The following studies were reviewed today:   EKG:  EKG is  ordered today.  The ekg ordered today demonstrates sinus bradycardia, heart rate 58, otherwise normal ECG  Recent Labs: 04/22/2021: ALT 21 12/26/2021: BUN 16; Creatinine, Ser 0.65; Hemoglobin  13.4; Platelets 285; Potassium 4.0; Sodium 138; TSH 3.150  Recent Lipid Panel    Component Value Date/Time   CHOL 172 04/29/2013 1003   TRIG 195 (H) 04/29/2013 1003   HDL 39 (L) 04/29/2013 1003   CHOLHDL 4.4 04/29/2013 1003   VLDL 39 04/29/2013 1003   LDLCALC 94 04/29/2013 1003     Risk Assessment/Calculations:     HYPERTENSION CONTROL Vitals:   03/24/22 0848 03/24/22 0857  BP: (!) 152/90 (!) 144/86    The patient's blood pressure is elevated above target today.  In order to address the patient's elevated BP: A new medication was prescribed today.            Physical Exam:    VS:  BP (!) 144/86 (BP Location: Right Arm)   Pulse (!) 58   Ht '5\' 4"'$  (1.626 m)   Wt 192 lb (87.1 kg)   SpO2 98%   BMI 32.96 kg/m     Wt Readings from Last 3 Encounters:  03/24/22 192 lb (87.1 kg)  12/26/21 190 lb (86.2 kg)  12/13/21 186 lb 6 oz (84.5 kg)     GEN:  Well nourished, well developed in no acute distress HEENT: Normal NECK: No JVD; No carotid bruits CARDIAC: RRR, no murmurs, rubs, gallops RESPIRATORY:  Clear to auscultation without rales, wheezing or rhonchi  ABDOMEN: Soft, non-tender, non-distended MUSCULOSKELETAL:  trace edema; varicose veins in lower extremities  noted SKIN: Warm and dry NEUROLOGIC:  Alert and oriented x 3 PSYCHIATRIC:  Normal affect   ASSESSMENT:    1. Primary hypertension   2. Mixed hyperlipidemia   3. Leg edema    PLAN:    In order of problems listed above:  Hypertension, BP elevated, start HCTZ 12.5 mg daily, continue losartan 100 mg daily. Hyperlipidemia, obtain fasting lipid profile, scheduled for likely a calcium score next month in [place].  Advised to fax reports to [place] after study. Leg edema, likely from varicose veins/venous insufficiency.  Leg raising advised.  HCTZ as above for BP control, obtain echo.   Follow-up in 6 to 8 weeks after echo.     Medication Adjustments/Labs and Tests Ordered: Current medicines are reviewed at length with the patient today.  Concerns regarding medicines are outlined above.  Orders Placed This Encounter  Procedures   Lipid panel   EKG 12-Lead   ECHOCARDIOGRAM COMPLETE   Meds ordered this encounter  Medications   hydrochlorothiazide (HYDRODIURIL) 25 MG tablet    Sig: Take 0.5 tablets (12.5 mg total) by mouth daily.    Dispense:  30 tablet    Refill:  3    Patient Instructions  Medication Instructions:   Your physician has recommended you make the following change in your medication:    START taking Hydrochlorothiazide 12.5 MG once a day.  *If you need a refill on your cardiac medications before your next appointment, please call your pharmacy*   Lab Work:  Please go to the [facility] for a Fasting Lipid Panel after your appointment today.   Testing/Procedures:  Your physician has requested that you have an echocardiogram. Echocardiography is a painless test that uses sound waves to create images of your heart. It provides your doctor with information about the size and shape of your heart and how well your heart's chambers and valves are working. This procedure takes approximately one hour. There are no restrictions for this procedure. Please do NOT wear  cologne, perfume, aftershave, or lotions (deodorant is allowed). Please arrive 15 minutes  prior to your appointment time.    Follow-Up: At [facility], you and your health needs are our priority.  As part of our continuing mission to provide you with exceptional heart care, we have created designated Provider Care Teams.  These Care Teams include your primary Cardiologist (physician) and Advanced Practice Providers (APPs -  Physician Assistants and Nurse Practitioners) who all work together to provide you with the care you need, when you need it.  We recommend signing up for the patient portal called "MyChart".  Sign up information is provided on this After Visit Summary.  MyChart is used to connect with patients for Virtual Visits (Telemedicine).  Patients are able to view lab/test results, encounter notes, upcoming appointments, etc.  Non-urgent messages can be sent to your provider as well.   To learn more about what you can do with MyChart, go to [email or web address].    Your next appointment:   Follow up after testing   The format for your next appointment:   In Person  Provider:   You may see Kate Sable, MD or one of the following Advanced Practice Providers on your designated Care Team:   Murray Hodgkins, NP Christell Faith, PA-C Cadence Kathlen Mody, PA-C Gerrie Nordmann, NP    Other Instructions   Important Information About Sugar         Signed, Kate Sable, MD  03/24/2022 10:23 AM    [place]

## 2022-03-24 NOTE — Patient Instructions (Signed)
Medication Instructions:   Your physician has recommended you make the following change in your medication:    START taking Hydrochlorothiazide 12.5 MG once a day.  *If you need a refill on your cardiac medications before your next appointment, please call your pharmacy*   Lab Work:  Please go to the [facility] for a Fasting Lipid Panel after your appointment today.   Testing/Procedures:  Your physician has requested that you have an echocardiogram. Echocardiography is a painless test that uses sound waves to create images of your heart. It provides your doctor with information about the size and shape of your heart and how well your heart's chambers and valves are working. This procedure takes approximately one hour. There are no restrictions for this procedure. Please do NOT wear cologne, perfume, aftershave, or lotions (deodorant is allowed). Please arrive 15 minutes prior to your appointment time.    Follow-Up: At [facility], you and your health needs are our priority.  As part of our continuing mission to provide you with exceptional heart care, we have created designated Provider Care Teams.  These Care Teams include your primary Cardiologist (physician) and Advanced Practice Providers (APPs -  Physician Assistants and Nurse Practitioners) who all work together to provide you with the care you need, when you need it.  We recommend signing up for the patient portal called "MyChart".  Sign up information is provided on this After Visit Summary.  MyChart is used to connect with patients for Virtual Visits (Telemedicine).  Patients are able to view lab/test results, encounter notes, upcoming appointments, etc.  Non-urgent messages can be sent to your provider as well.   To learn more about what you can do with MyChart, go to [email or web address].    Your next appointment:   Follow up after testing   The format for your next appointment:   In Person  Provider:    You may see Kate Sable, MD or one of the following Advanced Practice Providers on your designated Care Team:   Murray Hodgkins, NP Christell Faith, PA-C Cadence Kathlen Mody, PA-C Gerrie Nordmann, NP    Other Instructions   Important Information About Sugar

## 2022-03-25 ENCOUNTER — Telehealth: Payer: Self-pay

## 2022-03-25 MED ORDER — ATORVASTATIN CALCIUM 20 MG PO TABS: 20.0000 mg | ORAL_TABLET | Freq: Every day | ORAL | 3 refills | Status: DC

## 2022-03-25 NOTE — Telephone Encounter (Signed)
-----   Message from Kate Sable, MD sent at 03/24/2022  6:01 PM EDT ----- Cholesterol elevated, recommend starting Lipitor 20 mg daily.

## 2022-03-25 NOTE — Telephone Encounter (Signed)
The patient has been notified of the result and verbalized understanding.  All questions (if any) were answered. Kavin Leech, RN 03/25/2022 11:55 AM

## 2022-05-16 ENCOUNTER — Ambulatory Visit: Payer: Medicare PPO | Attending: Cardiology

## 2022-05-16 DIAGNOSIS — R600 Localized edema: Secondary | ICD-10-CM

## 2022-05-18 LAB — ECHOCARDIOGRAM COMPLETE
AR max vel: 1.99 cm2
AV Area VTI: 2.21 cm2
AV Area mean vel: 1.89 cm2
AV Mean grad: 4 mmHg
AV Peak grad: 8.2 mmHg
Ao pk vel: 1.43 m/s
Area-P 1/2: 3.2 cm2
S' Lateral: 2.9 cm
Single Plane A4C EF: 63.4 %

## 2022-05-30 ENCOUNTER — Encounter: Payer: Self-pay | Admitting: Cardiology

## 2022-05-30 ENCOUNTER — Ambulatory Visit: Payer: Medicare PPO | Attending: Cardiology | Admitting: Cardiology

## 2022-05-30 VITALS — BP 120/70 | HR 63 | Ht 64.0 in | Wt 193.2 lb

## 2022-05-30 DIAGNOSIS — E782 Mixed hyperlipidemia: Secondary | ICD-10-CM

## 2022-05-30 DIAGNOSIS — I2510 Atherosclerotic heart disease of native coronary artery without angina pectoris: Secondary | ICD-10-CM

## 2022-05-30 DIAGNOSIS — I10 Essential (primary) hypertension: Secondary | ICD-10-CM | POA: Diagnosis not present

## 2022-05-30 DIAGNOSIS — R600 Localized edema: Secondary | ICD-10-CM | POA: Diagnosis not present

## 2022-05-30 DIAGNOSIS — I2584 Coronary atherosclerosis due to calcified coronary lesion: Secondary | ICD-10-CM

## 2022-05-30 MED ORDER — ASPIRIN 81 MG PO TBEC: 81.0000 mg | DELAYED_RELEASE_TABLET | Freq: Every day | ORAL | 3 refills | Status: AC

## 2022-05-30 NOTE — Progress Notes (Signed)
Cardiology Office Note:    Date:  05/30/2022   ID:  Charlotte Moses, DOB 11-21-1955, MRN [id number]  PCP:  Denton Lank, [place] Providers Cardiologist:  Kate Sable, MD     Referring MD: No ref. provider found   Chief Complaint  Patient presents with   Other    F/u echo discuss body scan. Meds reviewed verbally with pt.    History of Present Illness:    Charlotte Moses is a 67 y.o. female with a hx of hypertension, former smoker x20 years who presents due to elevated blood pressures.  He had a calcium score obtained 04/24/2022.  Three-vessel coronary calcification noted, total calcium score 164.  Denies chest pain or shortness of breath.  Blood pressures previously elevated, started on HCTZ for better BP control.  Losartan was continued.  States feeling well, denies chest pain or shortness of breath.  Echocardiogram 04/2022 EF 55 to 60%.   Past Medical History:  Diagnosis Date   Arthritis    knees   Asthma    altitude induced   Diabetes mellitus without complication ([place])    PT HAD BARIATRIC SURGERY AND DIABETES NOW RESOLVED   Hyperlipidemia    Obesity    Personal history of colonic polyps     Past Surgical History:  Procedure Laterality Date   BREAST EXCISIONAL BIOPSY Left 1990   benign   BREAST SURGERY     CATARACT EXTRACTION W/PHACO Left 11/19/2016   Procedure: CATARACT EXTRACTION PHACO AND INTRAOCULAR LENS PLACEMENT ([place]);  Surgeon: Estill Cotta, MD;  Location: [facility] ORS;  Service: Ophthalmology;  Laterality: Left;  [place] 00:57 AP% 23.8 CDE 28.01 Fluid pack lot # [id number]   CATARACT EXTRACTION W/PHACO Right 05/13/2017   Procedure: CATARACT EXTRACTION PHACO AND INTRAOCULAR LENS PLACEMENT ([place])  RIGHT;  Surgeon: Leandrew Koyanagi, MD;  Location: [place];  Service: Ophthalmology;  Laterality: Right;   COLONOSCOPY N/A 2008, 2011   COLONOSCOPY WITH PROPOFOL N/A 05/30/2015   Procedure: COLONOSCOPY WITH PROPOFOL;  Surgeon: Robert Bellow, MD;  Location: [facility] ENDOSCOPY;  Service: Endoscopy;  Laterality: N/A;   COLONOSCOPY WITH PROPOFOL N/A 12/13/2021   Procedure: COLONOSCOPY WITH PROPOFOL;  Surgeon: [name], MD;  Location: [facility] ENDOSCOPY;  Service: Gastroenterology;  Laterality: N/A;   EYE SURGERY Bilateral ~10 years ago   corrective   HEMORROIDECTOMY  07/30/2006   External hemorrhoid removed.   KNEE ARTHROSCOPY WITH MEDIAL MENISECTOMY Left 03/19/2017   Procedure: KNEE ARTHROSCOPY WITH MEDIAL MENISECTOMY, LIMITED SYNOVECTOMY, CHONDROPLASTY, EXCISION OF OSTEOPHYTES;  Surgeon: Thornton Park, MD;  Location: [facility] ORS;  Service: Orthopedics;  Laterality: Left;   KNEE SURGERY Left ~25 years ago   [place] N/A 09/05/2013   Procedure: LAPAROSCOPIC GASTRIC SLEEVE RESECTION and REPAIR OF HIATAL HERNIA, upper endoscopy;  Surgeon: Gayland Curry, MD;  Location: [facility];  Service: General;  Laterality: N/A;   LIPOSUCTION     POLYPECTOMY  2008   benign   VEIN SURGERY Right more than 10 years ago   varicose veins    Current Medications: Current Meds  Medication Sig   aspirin EC 81 MG tablet Take 1 tablet (81 mg total) by mouth daily. Swallow whole.   atorvastatin (LIPITOR) 20 MG tablet Take 1 tablet (20 mg total) by mouth daily.   Biotin w/ Vitamins C & E (HAIR/SKIN/NAILS PO) Take 1 tablet by mouth daily.   Calcium Citrate-Vitamin D (CALCIUM + D PO) Take 1 tablet by mouth daily.  Cyanocobalamin (B-12 SL) Place 1 tablet under the tongue daily.   hydrochlorothiazide (HYDRODIURIL) 25 MG tablet Take 0.5 tablets (12.5 mg total) by mouth daily.   losartan (COZAAR) 100 MG tablet Take 100 mg by mouth daily.   Multiple Vitamin (MULTIVITAMIN WITH MINERALS) TABS tablet Take 1 tablet by mouth daily.   naproxen sodium (ANAPROX) 220 MG tablet Take 220-440 mg by mouth 2 (two) times daily as needed (pain).      Allergies:   Latex, Adhesive [tape], Codeine, and Silicone   Social History   Socioeconomic  History   Marital status: Single    Spouse name: Not on file   Number of children: Not on file   Years of education: Not on file   Highest education level: Not on file  Occupational History   Not on file  Tobacco Use   Smoking status: Former    Packs/day: 1.00    Years: 20.00    Total pack years: 20.00    Types: Cigarettes    Quit date: 05/26/1988    Years since quitting: 34.0   Smokeless tobacco: Never  Vaping Use   Vaping Use: Never used  Substance and Sexual Activity   Alcohol use: Yes    Comment: occasional   Drug use: Yes    Types: Marijuana    Comment: gummies   Sexual activity: Yes    Birth control/protection: None  Other Topics Concern   Not on file  Social History Narrative   Not on file   Social Determinants of Health   Financial Resource Strain: Not on file  Food Insecurity: Not on file  Transportation Needs: Not on file  Physical Activity: Sufficiently Active (07/15/2017)   Exercise Vital Sign    Days of Exercise per Week: 5 days    Minutes of Exercise per Session: 30 min  Stress: No Stress Concern Present (07/15/2017)   [place]    Feeling of Stress : Not at all  Social Connections: Somewhat Isolated (07/15/2017)   Social Connection and Isolation Panel [NHANES]    Frequency of Communication with Friends and Family: More than three times a week    Frequency of Social Gatherings with Friends and Family: More than three times a week    Attends Religious Services: 1 to 4 times per year    Active Member of [employer] or Organizations: No    Attends Music therapist: Never    Marital Status: Never married     Family History: The patient's family history includes Arthritis in her father; Hyperlipidemia in an other family member; Hypertension in an other family member; Lymphoma in her mother; Obesity in an other family member; Pancreatic cancer (age of onset: 69) in her paternal  grandfather; Ulcers in an other family member. There is no history of Breast cancer.  ROS:   Please see the history of present illness.     All other systems reviewed and are negative.  EKGs/Labs/Other Studies Reviewed:    The following studies were reviewed today:   EKG:  EKG not  ordered today.    Recent Labs: 12/26/2021: BUN 16; Creatinine, Ser 0.65; Hemoglobin 13.4; Platelets 285; Potassium 4.0; Sodium 138; TSH 3.150  Recent Lipid Panel    Component Value Date/Time   CHOL 218 (H) 03/24/2022 0954   TRIG 116 03/24/2022 0954   HDL 66 03/24/2022 0954   CHOLHDL 3.3 03/24/2022 0954   VLDL 23 03/24/2022 0954   LDLCALC 129 (H)  03/24/2022 0954     Risk Assessment/Calculations:               Physical Exam:    VS:  BP 120/70 (BP Location: Left Arm, Patient Position: Sitting, Cuff Size: Normal)   Pulse 63   Ht '5\' 4"'$  (1.626 m)   Wt 193 lb 4 oz (87.7 kg)   SpO2 98%   BMI 33.17 kg/m     Wt Readings from Last 3 Encounters:  05/30/22 193 lb 4 oz (87.7 kg)  03/24/22 192 lb (87.1 kg)  12/26/21 190 lb (86.2 kg)     GEN:  Well nourished, well developed in no acute distress HEENT: Normal NECK: No JVD; No carotid bruits CARDIAC: RRR, no murmurs, rubs, gallops RESPIRATORY:  Clear to auscultation without rales, wheezing or rhonchi  ABDOMEN: Soft, non-tender, non-distended MUSCULOSKELETAL:  trace edema; varicose veins in lower extremities noted SKIN: Warm and dry NEUROLOGIC:  Alert and oriented x 3 PSYCHIATRIC:  Normal affect   ASSESSMENT:    1. Coronary artery calcification   2. Primary hypertension   3. Mixed hyperlipidemia   4. Leg edema     PLAN:    In order of problems listed above:  Three-vessel coronary calcification, calcium score 164.  Start aspirin, start Lipitor 20 mg daily. Hypertension, BP controlled.  Continue HCTZ 12.5 mg daily, losartan 100 mg daily. Hyperlipidemia, cholesterol elevated, compliant with Lipitor 20 mg daily.  Recheck Lipitor in 6  months. Leg edema, EF 55 to 60%.  Likely from varicose veins/venous insufficiency.  Leg raising advised.   Follow-up in 6 months.     Medication Adjustments/Labs and Tests Ordered: Current medicines are reviewed at length with the patient today.  Concerns regarding medicines are outlined above.  Orders Placed This Encounter  Procedures   Lipid panel   Meds ordered this encounter  Medications   aspirin EC 81 MG tablet    Sig: Take 1 tablet (81 mg total) by mouth daily. Swallow whole.    Dispense:  90 tablet    Refill:  3    Patient Instructions  Medication Instructions:   START Aspirin - take one tablet ('81mg'$ ) by mouth daily.   *If you need a refill on your cardiac medications before your next appointment, please call your pharmacy*   Lab Work:  Your physician recommends that you return for lab work in: [facility]. You will need to be fasting.  No appt is needed. Hours are M-F 7AM- 6 PM.  If you have labs (blood work) drawn today and your tests are completely normal, you will receive your results only by: [place] (if you have MyChart) OR A paper copy in the mail If you have any lab test that is abnormal or we need to change your treatment, we will call you to review the results.   Testing/Procedures:  None Ordered   Follow-Up: At [facility], you and your health needs are our priority.  As part of our continuing mission to provide you with exceptional heart care, we have created designated Provider Care Teams.  These Care Teams include your primary Cardiologist (physician) and Advanced Practice Providers (APPs -  Physician Assistants and Nurse Practitioners) who all work together to provide you with the care you need, when you need it.  We recommend signing up for the patient portal called "MyChart".  Sign up information is provided on this After Visit Summary.  MyChart is used to connect with patients for Virtual  Visits  (Telemedicine).  Patients are able to view lab/test results, encounter notes, upcoming appointments, etc.  Non-urgent messages can be sent to your provider as well.   To learn more about what you can do with MyChart, go to [email or web address].    Your next appointment:   6 month(s)  The format for your next appointment:   In Person  Provider:   You may see Kate Sable, MD or one of the following Advanced Practice Providers on your designated Care Team:   Murray Hodgkins, NP Christell Faith, PA-C Cadence Kathlen Mody, PA-C Gerrie Nordmann, NP      Signed, Kate Sable, MD  05/30/2022 4:47 PM    [place]

## 2022-05-30 NOTE — Patient Instructions (Signed)
Medication Instructions:   START Aspirin - take one tablet ('81mg'$ ) by mouth daily.   *If you need a refill on your cardiac medications before your next appointment, please call your pharmacy*   Lab Work:  Your physician recommends that you return for lab work in: 3 months at the medical mall. You will need to be fasting.  No appt is needed. Hours are M-F 7AM- 6 PM.  If you have labs (blood work) drawn today and your tests are completely normal, you will receive your results only by: [place] (if you have MyChart) OR A paper copy in the mail If you have any lab test that is abnormal or we need to change your treatment, we will call you to review the results.   Testing/Procedures:  None Ordered   Follow-Up: At [facility], you and your health needs are our priority.  As part of our continuing mission to provide you with exceptional heart care, we have created designated Provider Care Teams.  These Care Teams include your primary Cardiologist (physician) and Advanced Practice Providers (APPs -  Physician Assistants and Nurse Practitioners) who all work together to provide you with the care you need, when you need it.  We recommend signing up for the patient portal called "MyChart".  Sign up information is provided on this After Visit Summary.  MyChart is used to connect with patients for Virtual Visits (Telemedicine).  Patients are able to view lab/test results, encounter notes, upcoming appointments, etc.  Non-urgent messages can be sent to your provider as well.   To learn more about what you can do with MyChart, go to [email or web address].    Your next appointment:   6 month(s)  The format for your next appointment:   In Person  Provider:   You may see Kate Sable, MD or one of the following Advanced Practice Providers on your designated Care Team:   Murray Hodgkins, NP Christell Faith, PA-C Cadence Kathlen Mody, PA-C Gerrie Nordmann, NP

## 2022-06-23 ENCOUNTER — Other Ambulatory Visit: Payer: Self-pay | Admitting: Family Medicine

## 2022-06-23 DIAGNOSIS — N281 Cyst of kidney, acquired: Secondary | ICD-10-CM

## 2022-07-07 ENCOUNTER — Ambulatory Visit
Admission: RE | Admit: 2022-07-07 | Discharge: 2022-07-07 | Disposition: A | Discharge status: Home / Self Care | Payer: Medicare PPO | Source: Ambulatory Visit | Attending: Family Medicine | Admitting: Family Medicine

## 2022-07-07 DIAGNOSIS — N281 Cyst of kidney, acquired: Secondary | ICD-10-CM | POA: Diagnosis present

## 2022-07-18 ENCOUNTER — Other Ambulatory Visit: Payer: Self-pay

## 2022-07-18 MED ORDER — ATORVASTATIN CALCIUM 20 MG PO TABS: 20.0000 mg | ORAL_TABLET | Freq: Every day | ORAL | 3 refills | Status: DC

## 2022-07-22 ENCOUNTER — Other Ambulatory Visit: Payer: Self-pay | Admitting: Family Medicine

## 2022-07-22 DIAGNOSIS — N281 Cyst of kidney, acquired: Secondary | ICD-10-CM

## 2022-08-08 ENCOUNTER — Ambulatory Visit
Admission: RE | Admit: 2022-08-08 | Discharge: 2022-08-08 | Disposition: A | Discharge status: Home / Self Care | Payer: Medicare PPO | Source: Ambulatory Visit | Attending: Family Medicine | Admitting: Family Medicine

## 2022-08-08 DIAGNOSIS — N281 Cyst of kidney, acquired: Secondary | ICD-10-CM | POA: Diagnosis present

## 2022-08-08 MED ORDER — GADOBUTROL 1 MMOL/ML IV SOLN
7.5000 mL | Freq: Once | INTRAVENOUS | Status: AC | PRN
  Administered 2022-08-08: 7.5 mL via INTRAVENOUS

## 2022-08-27 ENCOUNTER — Other Ambulatory Visit
Admission: RE | Admit: 2022-08-27 | Discharge: 2022-08-27 | Disposition: A | Discharge status: Home / Self Care | Payer: Medicare PPO | Attending: Cardiology | Admitting: Cardiology

## 2022-08-27 DIAGNOSIS — E782 Mixed hyperlipidemia: Secondary | ICD-10-CM

## 2022-08-27 LAB — LIPID PANEL
Cholesterol: 179 mg/dL (ref 0–200)
HDL: 55 mg/dL (ref >40)
LDL Cholesterol: 110 mg/dL — ABNORMAL HIGH (ref 0–99)
Total CHOL/HDL Ratio: 3.3 RATIO
Triglycerides: 71 mg/dL (ref <150)
VLDL: 14 mg/dL (ref 0–40)

## 2022-08-29 ENCOUNTER — Other Ambulatory Visit: Payer: Self-pay

## 2022-08-29 MED ORDER — ATORVASTATIN CALCIUM 40 MG PO TABS: 40.0000 mg | ORAL_TABLET | Freq: Every day | ORAL | 3 refills | Status: DC

## 2022-11-03 NOTE — Progress Notes (Unsigned)
PCP: Hillery Aldo, MD   No chief complaint on file.   HPI:      [name] is a 67 y.o. G1P0010 whose LMP was No LMP recorded. Patient is postmenopausal., presents today for her annual examination.  Her menses are {norm/abn:715}, lasting {number: 22536} days.  Dysmenorrhea {dysmen:716}. She {does:18564} have intermenstrual bleeding. She {does:18564} have vasomotor sx.   Sex activity: {sex active: 315163}. She {does:18564} have vaginal dryness.  Last Pap: 10/28/21 Results were: low-grade squamous intraepithelial neoplasia (LGSIL - encompassing HPV,mild dysplasia,CIN I) /POS HPV DNA. Pt declined repeat colpo.  Last Pap: 2022. 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 There is no FH of ovarian cancer. The patient {does:18564} do self-breast exams.  Colonoscopy: 7/23 at [facility] with polyps; Repeat due after 7 years.   Tobacco use: {tob:20664} Alcohol use: {Alcohol:11675} No drug use Exercise: {exercise:31265}  She {does:18564} get adequate calcium and Vitamin D in her diet.  Labs with PCP.   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    Past Surgical History:  Procedure Laterality Date   BREAST EXCISIONAL BIOPSY Left 1990   benign   BREAST SURGERY     CATARACT EXTRACTION W/PHACO Left 11/19/2016   Procedure: CATARACT EXTRACTION PHACO AND INTRAOCULAR LENS PLACEMENT (IOC);  Surgeon: Sallee Lange, MD;  Location: [facility] ORS;  Service: Ophthalmology;  Laterality: Left;  [place] 00:57 AP% 23.8 CDE 28.01 Fluid pack lot # [id number]   CATARACT EXTRACTION W/PHACO Right 05/13/2017   Procedure: CATARACT EXTRACTION PHACO AND  INTRAOCULAR LENS PLACEMENT (IOC)  RIGHT;  Surgeon: Lockie Mola, MD;  Location: [facility];  Service: Ophthalmology;  Laterality: Right;   COLONOSCOPY N/A 2008, 2011   COLONOSCOPY WITH PROPOFOL N/A 05/30/2015   Procedure: COLONOSCOPY WITH PROPOFOL;  Surgeon: Earline Mayotte, MD;  Location: [facility] ENDOSCOPY;  Service: Endoscopy;  Laterality: N/A;   COLONOSCOPY WITH PROPOFOL N/A 12/13/2021   Procedure: COLONOSCOPY WITH PROPOFOL;  Surgeon: Wyline Mood, MD;  Location: [facility] ENDOSCOPY;  Service: Gastroenterology;  Laterality: N/A;   EYE SURGERY Bilateral ~10 years ago   corrective   HEMORROIDECTOMY  07/30/2006   External hemorrhoid removed.   KNEE ARTHROSCOPY WITH MEDIAL MENISECTOMY Left 03/19/2017   Procedure: KNEE ARTHROSCOPY WITH MEDIAL MENISECTOMY, LIMITED SYNOVECTOMY, CHONDROPLASTY, EXCISION OF OSTEOPHYTES;  Surgeon: Juanell Fairly, MD;  Location: [facility] ORS;  Service: Orthopedics;  Laterality: Left;   KNEE SURGERY Left ~25 years ago   LAPAROSCOPIC GASTRIC SLEEVE RESECTION N/A 09/05/2013   Procedure: LAPAROSCOPIC GASTRIC SLEEVE RESECTION and REPAIR OF HIATAL HERNIA, upper endoscopy;  Surgeon: Atilano Ina, MD;  Location: [facility];  Service: General;  Laterality: N/A;   LIPOSUCTION     POLYPECTOMY  2008   benign   VEIN SURGERY Right more than 10 years ago   varicose veins    Family History  Problem Relation Age of Onset   Lymphoma Mother    Arthritis Father    Hyperlipidemia Other    Hypertension Other    Obesity Other    Ulcers Other    Pancreatic cancer Paternal Grandfather 71   Breast cancer Neg Hx     Social History  Socioeconomic History   Marital status: Single    Spouse name: Not on file   Number of children: Not on file   Years of education: Not on file   Highest education level: Not on file  Occupational History   Not on file  Tobacco Use   Smoking status: Former    Packs/day: 1.00    Years: 20.00    Additional pack years: 0.00    Total pack  years: 20.00    Types: Cigarettes    Quit date: 05/26/1988    Years since quitting: 34.4   Smokeless tobacco: Never  Vaping Use   Vaping Use: Never used  Substance and Sexual Activity   Alcohol use: Yes    Comment: occasional   Drug use: Yes    Types: Marijuana    Comment: gummies   Sexual activity: Yes    Birth control/protection: None  Other Topics Concern   Not on file  Social History Narrative   Not on file   Social Determinants of Health   Financial Resource Strain: Not on file  Food Insecurity: Not on file  Transportation Needs: Not on file  Physical Activity: Sufficiently Active (07/15/2017)   Exercise Vital Sign    Days of Exercise per Week: 5 days    Minutes of Exercise per Session: 30 min  Stress: No Stress Concern Present (07/15/2017)   [employer] of Occupational Health - Occupational Stress Questionnaire    Feeling of Stress : Not at all  Social Connections: Somewhat Isolated (07/15/2017)   Social Connection and Isolation Panel [NHANES]    Frequency of Communication with Friends and Family: More than three times a week    Frequency of Social Gatherings with Friends and Family: More than three times a week    Attends Religious Services: 1 to 4 times per year    Active Member of [employer] or Organizations: No    Attends Banker Meetings: Never    Marital Status: Never married  Intimate Partner Violence: Not At Risk (07/15/2017)   Humiliation, Afraid, Rape, and Kick questionnaire    Fear of Current or Ex-Partner: No    Emotionally Abused: No    Physically Abused: No    Sexually Abused: No     Current Outpatient Medications:    aspirin EC 81 MG tablet, Take 1 tablet (81 mg total) by mouth daily. Swallow whole., Disp: 90 tablet, Rfl: 3   atorvastatin (LIPITOR) 40 MG tablet, Take 1 tablet (40 mg total) by mouth daily., Disp: 90 tablet, Rfl: 3   Biotin w/ Vitamins C & E (HAIR/SKIN/NAILS PO), Take 1 tablet by mouth daily., Disp: , Rfl:    Calcium  Citrate-Vitamin D (CALCIUM + D PO), Take 1 tablet by mouth daily., Disp: , Rfl:    Cyanocobalamin (B-12 SL), Place 1 tablet under the tongue daily., Disp: , Rfl:    hydrochlorothiazide (HYDRODIURIL) 25 MG tablet, Take 0.5 tablets (12.5 mg total) by mouth daily., Disp: 30 tablet, Rfl: 3   losartan (COZAAR) 100 MG tablet, Take 100 mg by mouth daily., Disp: , Rfl:    Multiple Vitamin (MULTIVITAMIN WITH MINERALS) TABS tablet, Take 1 tablet by mouth daily., Disp: , Rfl:    naproxen sodium (ANAPROX) 220 MG tablet, Take 220-440 mg by mouth 2 (two) times daily as needed (pain). , Disp: , Rfl:      ROS:  Review of Systems BREAST: No symptoms    Objective: There were no vitals taken for this visit.  OBGyn Exam  Results: No results found for this or any previous visit (from the past 24 hour(s)).  Assessment/Plan:  No diagnosis found.   No orders of the defined types were placed in this encounter.           GYN counsel {counseling: 16159}    F/U  No follow-ups on file.  Herminio Kniskern B. Laray Rivkin, PA-C 11/03/2022 5:05 PM

## 2022-11-04 ENCOUNTER — Ambulatory Visit (INDEPENDENT_AMBULATORY_CARE_PROVIDER_SITE_OTHER): Payer: Medicare PPO | Admitting: Obstetrics and Gynecology

## 2022-11-04 ENCOUNTER — Other Ambulatory Visit (HOSPITAL_COMMUNITY)
Admission: RE | Admit: 2022-11-04 | Discharge: 2022-11-04 | Disposition: A | Discharge status: Home / Self Care | Payer: Medicare PPO | Source: Ambulatory Visit | Attending: Obstetrics and Gynecology | Admitting: Obstetrics and Gynecology

## 2022-11-04 ENCOUNTER — Encounter: Payer: Self-pay | Admitting: Obstetrics and Gynecology

## 2022-11-04 VITALS — BP 122/80 | Ht 64.0 in | Wt 184.0 lb

## 2022-11-04 DIAGNOSIS — Z01419 Encounter for gynecological examination (general) (routine) without abnormal findings: Secondary | ICD-10-CM | POA: Insufficient documentation

## 2022-11-04 DIAGNOSIS — Z87410 Personal history of cervical dysplasia: Secondary | ICD-10-CM

## 2022-11-04 DIAGNOSIS — Z124 Encounter for screening for malignant neoplasm of cervix: Secondary | ICD-10-CM | POA: Insufficient documentation

## 2022-11-04 DIAGNOSIS — Z1231 Encounter for screening mammogram for malignant neoplasm of breast: Secondary | ICD-10-CM

## 2022-11-04 DIAGNOSIS — Z1151 Encounter for screening for human papillomavirus (HPV): Secondary | ICD-10-CM | POA: Diagnosis not present

## 2022-11-04 NOTE — Patient Instructions (Signed)
I value your feedback and you entrusting us with your care. If you get a [place] patient survey, I would appreciate you taking the time to let us know about your experience today. Thank you! ? ? ?

## 2022-11-06 LAB — CYTOLOGY - PAP
Adequacy: ABSENT
Comment: NEGATIVE
High risk HPV: NEGATIVE

## 2022-11-07 ENCOUNTER — Other Ambulatory Visit: Payer: Self-pay

## 2022-11-07 MED ORDER — ATORVASTATIN CALCIUM 20 MG PO TABS: 20.0000 mg | ORAL_TABLET | Freq: Every day | ORAL | 1 refills | Status: DC

## 2022-11-07 NOTE — Telephone Encounter (Signed)
Called patient to verify dosing.  Patient reports that she was advised to increase but she has not and does not want to increase just yet.

## 2022-11-07 NOTE — Telephone Encounter (Signed)
Refill for Atorvastatin 20mg  QD. MD response to 08/27/22 lab result:  Overall cholesterol improved, bad cholesterol/LDL still elevated.  Increase Lipitor to 40 mg daily.    Patient is active in MyChart.  Will send MyChart message for clarification on Atorvastatin dosing.

## 2022-11-10 ENCOUNTER — Other Ambulatory Visit: Payer: Self-pay

## 2022-11-10 MED ORDER — HYDROCHLOROTHIAZIDE 25 MG PO TABS: 12.5000 mg | ORAL_TABLET | Freq: Every day | ORAL | 0 refills | Status: DC

## 2022-12-08 ENCOUNTER — Other Ambulatory Visit: Payer: Self-pay

## 2022-12-08 MED ORDER — HYDROCHLOROTHIAZIDE 25 MG PO TABS: 12.5000 mg | ORAL_TABLET | Freq: Every day | ORAL | 0 refills | Status: DC

## 2022-12-08 NOTE — Telephone Encounter (Signed)
Requested Prescriptions   Signed Prescriptions Disp Refills   hydrochlorothiazide (HYDRODIURIL) 25 MG tablet 15 tablet 0    Sig: Take 0.5 tablets (12.5 mg total) by mouth daily. PLEASE SCHEDULE OFFICE VISIT FOR FURTHER REFILLS. THANK YOU!    Authorizing Provider: Debbe Odea    Ordering User: Guerry Minors

## 2022-12-24 ENCOUNTER — Encounter: Payer: Self-pay | Admitting: Cardiology

## 2023-03-16 LAB — HM MAMMOGRAPHY

## 2023-03-17 ENCOUNTER — Encounter: Payer: Self-pay | Admitting: Obstetrics and Gynecology

## 2023-03-26 ENCOUNTER — Other Ambulatory Visit: Payer: Self-pay | Admitting: Cardiology

## 2023-03-26 MED ORDER — HYDROCHLOROTHIAZIDE 25 MG PO TABS: 12.5000 mg | ORAL_TABLET | Freq: Every day | ORAL | 0 refills | Status: DC

## 2023-04-17 ENCOUNTER — Encounter: Payer: Self-pay | Admitting: Cardiology

## 2023-04-17 ENCOUNTER — Other Ambulatory Visit: Payer: Self-pay

## 2023-04-17 ENCOUNTER — Ambulatory Visit: Payer: Medicare PPO | Attending: Cardiology | Admitting: Cardiology

## 2023-04-17 VITALS — BP 116/84 | HR 58 | Ht 64.0 in | Wt 188.0 lb

## 2023-04-17 DIAGNOSIS — E782 Mixed hyperlipidemia: Secondary | ICD-10-CM | POA: Diagnosis not present

## 2023-04-17 DIAGNOSIS — I10 Essential (primary) hypertension: Secondary | ICD-10-CM

## 2023-04-17 DIAGNOSIS — I2510 Atherosclerotic heart disease of native coronary artery without angina pectoris: Secondary | ICD-10-CM | POA: Diagnosis not present

## 2023-04-17 MED ORDER — HYDROCHLOROTHIAZIDE 25 MG PO TABS: 12.5000 mg | ORAL_TABLET | Freq: Every day | ORAL | 0 refills | Status: DC

## 2023-04-17 MED ORDER — ATORVASTATIN CALCIUM 20 MG PO TABS: 20.0000 mg | ORAL_TABLET | Freq: Every day | ORAL | 1 refills | Status: DC

## 2023-04-17 NOTE — Progress Notes (Signed)
Cardiology Office Note:    Date:  04/17/2023   ID:  Charlotte Moses, DOB [birth year], MRN [id number]  PCP:  Hillery Aldo, MD   [facility] Providers Cardiologist:  Debbe Odea, MD     Referring MD: Hillery Aldo, MD   Chief Complaint  Patient presents with   Follow-up    Patient denies new or acute cardiac problems/concerns today.      History of Present Illness:    Charlotte Moses is a 67 y.o. female with a hx of hypertension, hyperlipidemia, coronary calcification former smoker x20 years who presents for follow-up.  Denies chest pain, denies shortness of breath.  BP adequately controlled.  Compliant with medications as prescribed.  No new cardiac concerns today.  Prior notes/testing Echocardiogram 04/2022 EF 55 to 60%.   Past Medical History:  Diagnosis Date   Arthritis    knees   Asthma    altitude induced   Diabetes mellitus without complication (HCC)    PT HAD BARIATRIC SURGERY AND DIABETES NOW RESOLVED   Hyperlipidemia    Obesity    Personal history of colonic polyps     Past Surgical History:  Procedure Laterality Date   BREAST EXCISIONAL BIOPSY Left 1990   benign   BREAST SURGERY     CATARACT EXTRACTION W/PHACO Left 11/19/2016   Procedure: CATARACT EXTRACTION PHACO AND INTRAOCULAR LENS PLACEMENT (IOC);  Surgeon: Sallee Lange, MD;  Location: [facility] ORS;  Service: Ophthalmology;  Laterality: Left;  [place] 00:57 AP% 23.8 CDE 28.01 Fluid pack lot # [id number] H   CATARACT EXTRACTION W/PHACO Right 05/13/2017   Procedure: CATARACT EXTRACTION PHACO AND INTRAOCULAR LENS PLACEMENT (IOC)  RIGHT;  Surgeon: Lockie Mola, MD;  Location: [facility];  Service: Ophthalmology;  Laterality: Right;   COLONOSCOPY N/A 2008, 2011   COLONOSCOPY WITH PROPOFOL N/A 05/30/2015   Procedure: COLONOSCOPY WITH PROPOFOL;  Surgeon: Earline Mayotte, MD;  Location: [facility] ENDOSCOPY;  Service: Endoscopy;  Laterality: N/A;   COLONOSCOPY WITH PROPOFOL N/A 12/13/2021    Procedure: COLONOSCOPY WITH PROPOFOL;  Surgeon: Wyline Mood, MD;  [facility];  Service: Gastroenterology;  Laterality: N/A;   EYE SURGERY Bilateral ~10 years ago   corrective   HEMORROIDECTOMY  07/30/2006   External hemorrhoid removed.   KNEE ARTHROSCOPY WITH MEDIAL MENISECTOMY Left 03/19/2017   Procedure: KNEE ARTHROSCOPY WITH MEDIAL MENISECTOMY, LIMITED SYNOVECTOMY, CHONDROPLASTY, EXCISION OF OSTEOPHYTES;  Surgeon: Juanell Fairly, MD;  Location: [facility] ORS;  Service: Orthopedics;  Laterality: Left;   KNEE SURGERY Left ~25 years ago   LAPAROSCOPIC GASTRIC SLEEVE RESECTION N/A 09/05/2013   Procedure: LAPAROSCOPIC GASTRIC SLEEVE RESECTION and REPAIR OF HIATAL HERNIA, upper endoscopy;  Surgeon: Atilano Ina, MD;  Location: [facility] ORS;  Service: General;  Laterality: N/A;   LIPOSUCTION     POLYPECTOMY  2008   benign   VEIN SURGERY Right more than 10 years ago   varicose veins    Current Medications: Current Meds  Medication Sig   Biotin w/ Vitamins C & E (HAIR/SKIN/NAILS PO) Take 1 tablet by mouth daily.   Calcium Citrate-Vitamin D (CALCIUM + D PO) Take 1 tablet by mouth daily.   Cyanocobalamin (B-12 SL) Place 1 tablet under the tongue daily.   losartan (COZAAR) 50 MG tablet    Multiple Vitamin (MULTIVITAMIN WITH MINERALS) TABS tablet Take 1 tablet by mouth daily.   naproxen sodium (ANAPROX) 220 MG tablet Take 220-440 mg by mouth 2 (two) times daily as needed (pain).    PARoxetine (PAXIL) 10 MG  tablet Take 10 mg by mouth daily.   [DISCONTINUED] atorvastatin (LIPITOR) 20 MG tablet Take 1 tablet (20 mg total) by mouth daily.   [DISCONTINUED] hydrochlorothiazide (HYDRODIURIL) 25 MG tablet Take 0.5 tablets (12.5 mg total) by mouth daily. PLEASE SCHEDULE OFFICE VISIT FOR FURTHER REFILLS. THANK YOU!     Allergies:   Latex, Adhesive [tape], Codeine, and Silicone   Social History   Socioeconomic History   Marital status: Single    Spouse name: Not on file   Number of children:  Not on file   Years of education: Not on file   Highest education level: Not on file  Occupational History   Not on file  Tobacco Use   Smoking status: Former    Current packs/day: 0.00    Average packs/day: 1 pack/day for 20.0 years (20.0 ttl pk-yrs)    Types: Cigarettes    Start date: 05/26/1968    Quit date: 05/26/1988    Years since quitting: 34.9   Smokeless tobacco: Never  Vaping Use   Vaping status: Never Used  Substance and Sexual Activity   Alcohol use: Yes    Comment: occasional   Drug use: Yes    Types: Marijuana    Comment: gummies   Sexual activity: Yes    Birth control/protection: None, Post-menopausal  Other Topics Concern   Not on file  Social History Narrative   Not on file   Social Determinants of Health   Financial Resource Strain: Not on file  Food Insecurity: Not on file  Transportation Needs: Not on file  Physical Activity: Sufficiently Active (07/15/2017)   Exercise Vital Sign    Days of Exercise per Week: 5 days    Minutes of Exercise per Session: 30 min  Stress: No Stress Concern Present (07/15/2017)   [employer] of Occupational Health - Occupational Stress Questionnaire    Feeling of Stress : Not at all  Social Connections: Somewhat Isolated (07/15/2017)   Social Connection and Isolation Panel [NHANES]    Frequency of Communication with Friends and Family: More than three times a week    Frequency of Social Gatherings with Friends and Family: More than three times a week    Attends Religious Services: 1 to 4 times per year    Active Member of [employer] or Organizations: No    Attends Engineer, structural: Never    Marital Status: Never married     Family History: The patient's family history includes Arthritis in her father; Hyperlipidemia in an other family member; Hypertension in an other family member; Lymphoma in her mother; Obesity in an other family member; Pancreatic cancer (age of onset: 88) in her paternal grandfather; Ulcers  in an other family member. There is no history of Breast cancer.  ROS:   Please see the history of present illness.     All other systems reviewed and are negative.  EKGs/Labs/Other Studies Reviewed:    The following studies were reviewed today:   EKG Interpretation Date/Time:  Friday April 17 2023 11:36:50 EST Ventricular Rate:  58 PR Interval:  156 QRS Duration:  72 QT Interval:  430 QTC Calculation: 422 R Axis:   17  Text Interpretation: Sinus bradycardia Confirmed by Debbe Odea ([id number]) on 04/17/2023 11:46:00 AM     Recent Labs: No results found for requested labs within last 365 days.  Recent Lipid Panel    Component Value Date/Time   CHOL 179 08/27/2022 0902   TRIG 71 08/27/2022 0902   HDL 55  08/27/2022 0902   CHOLHDL 3.3 08/27/2022 0902   VLDL 14 08/27/2022 0902   LDLCALC 110 (H) 08/27/2022 0902     Risk Assessment/Calculations:               Physical Exam:    VS:  BP 116/84 (BP Location: Left Arm, Patient Position: Sitting, Cuff Size: Large)   Pulse (!) 58   Ht 5\' 4"  (1.626 m)   Wt 188 lb (85.3 kg)   SpO2 97%   BMI 32.27 kg/m     Wt Readings from Last 3 Encounters:  04/17/23 188 lb (85.3 kg)  11/04/22 184 lb (83.5 kg)  05/30/22 193 lb 4 oz (87.7 kg)     GEN:  Well nourished, well developed in no acute distress HEENT: Normal NECK: No JVD; No carotid bruits CARDIAC: RRR, no murmurs, rubs, gallops RESPIRATORY:  Clear to auscultation without rales, wheezing or rhonchi  ABDOMEN: Soft, non-tender, non-distended MUSCULOSKELETAL:  trace edema; varicose veins in lower extremities noted SKIN: Warm and dry NEUROLOGIC:  Alert and oriented x 3 PSYCHIATRIC:  Normal affect   ASSESSMENT:    1. Coronary artery calcification   2. Primary hypertension   3. Mixed hyperlipidemia     PLAN:    In order of problems listed above:  Three-vessel coronary calcification, calcium score 164.  Echo 12/23 EF 55 to 60%.  Continue aspirin, Lipitor 20  mg daily. Hypertension, BP controlled.  Continue HCTZ 12.5 mg daily, losartan 50 mg daily. Hyperlipidemia, continue Lipitor 20 mg daily.  Recheck Lipitor in 6 months.  Titrate Lipitor if LDL not at goal.  Follow-up in 6 months.     Medication Adjustments/Labs and Tests Ordered: Current medicines are reviewed at length with the patient today.  Concerns regarding medicines are outlined above.  Orders Placed This Encounter  Procedures   Lipid panel   EKG 12-Lead   Meds ordered this encounter  Medications   atorvastatin (LIPITOR) 20 MG tablet    Sig: Take 1 tablet (20 mg total) by mouth daily.    Dispense:  30 tablet    Refill:  1   hydrochlorothiazide (HYDRODIURIL) 25 MG tablet    Sig: Take 0.5 tablets (12.5 mg total) by mouth daily. PLEASE SCHEDULE OFFICE VISIT FOR FURTHER REFILLS. THANK YOU!    Dispense:  15 tablet    Refill:  0    Patient Instructions  Medication Instructions:   Your physician recommends that you continue on your current medications as directed. Please refer to the Current Medication list given to you today.  *If you need a refill on your cardiac medications before your next appointment, please call your pharmacy*   Lab Work:  Your provider would like for you to return in 6 months (10/15/2023) to have the following labs drawn: LIPID  Please go to [facility] [address] ([facility] [address], [place] [address] You do not need an appointment.  They are open from 7:30 am-4 pm.  Lunch from 1:00 pm- 2:00 pm You WILL need to be fasting.   If you have labs (blood work) drawn today and your tests are completely normal, you will receive your results only by: MyChart Message (if you have MyChart) OR A paper copy in the mail If you have any lab test that is abnormal or we need to change your treatment, we will call you to review the results.   Testing/Procedures:  None Ordered   Follow-Up: At [facility], you and your  health  needs are our priority.  As part of our continuing mission to provide you with exceptional heart care, we have created designated Provider Care Teams.  These Care Teams include your primary Cardiologist (physician) and Advanced Practice Providers (APPs -  Physician Assistants and Nurse Practitioners) who all work together to provide you with the care you need, when you need it.  We recommend signing up for the patient portal called "MyChart".  Sign up information is provided on this After Visit Summary.  MyChart is used to connect with patients for Virtual Visits (Telemedicine).  Patients are able to view lab/test results, encounter notes, upcoming appointments, etc.  Non-urgent messages can be sent to your provider as well.   To learn more about what you can do with MyChart, go to [email or web address].    Your next appointment:   12 month(s)  Provider:   You may see Debbe Odea, MD or one of the following Advanced Practice Providers on your designated Care Team:   Nicolasa Ducking, NP Eula Listen, PA-C Cadence Fransico Michael, PA-C Charlsie Quest, NP Carlos Levering, NP   Signed, Debbe Odea, MD  04/17/2023 12:58 PM    [facility]

## 2023-04-17 NOTE — Patient Instructions (Addendum)
Medication Instructions:   Your physician recommends that you continue on your current medications as directed. Please refer to the Current Medication list given to you today.  *If you need a refill on your cardiac medications before your next appointment, please call your pharmacy*   Lab Work:  Your provider would like for you to return in 6 months (10/15/2023) to have the following labs drawn: LIPID  Please go to [facility] [address] ([facility]) [address], [place] [address] You do not need an appointment.  They are open from 7:30 am-4 pm.  Lunch from 1:00 pm- 2:00 pm You WILL need to be fasting.   If you have labs (blood work) drawn today and your tests are completely normal, you will receive your results only by: MyChart Message (if you have MyChart) OR A paper copy in the mail If you have any lab test that is abnormal or we need to change your treatment, we will call you to review the results.   Testing/Procedures:  None Ordered   Follow-Up: At [facility], you and your health needs are our priority.  As part of our continuing mission to provide you with exceptional heart care, we have created designated Provider Care Teams.  These Care Teams include your primary Cardiologist (physician) and Advanced Practice Providers (APPs -  Physician Assistants and Nurse Practitioners) who all work together to provide you with the care you need, when you need it.  We recommend signing up for the patient portal called "MyChart".  Sign up information is provided on this After Visit Summary.  MyChart is used to connect with patients for Virtual Visits (Telemedicine).  Patients are able to view lab/test results, encounter notes, upcoming appointments, etc.  Non-urgent messages can be sent to your provider as well.   To learn more about what you can do with MyChart, go to [email or web address].    Your next appointment:   12 month(s)  Provider:   You  may see Debbe Odea, MD or one of the following Advanced Practice Providers on your designated Care Team:   Nicolasa Ducking, NP Eula Listen, PA-C Cadence Fransico Michael, PA-C Charlsie Quest, NP Carlos Levering, NP

## 2023-04-30 LAB — LIPID PANEL
Chol/HDL Ratio: 2.6 {ratio} (ref 0.0–4.4)
Cholesterol, Total: 160 mg/dL (ref 100–199)
HDL: 62 mg/dL (ref >39)
LDL Chol Calc (NIH): 79 mg/dL (ref 0–99)
Triglycerides: 108 mg/dL (ref 0–149)
VLDL Cholesterol Cal: 19 mg/dL (ref 5–40)

## 2023-05-25 ENCOUNTER — Other Ambulatory Visit: Payer: Self-pay | Admitting: *Deleted

## 2023-05-25 MED ORDER — HYDROCHLOROTHIAZIDE 25 MG PO TABS: 12.5000 mg | ORAL_TABLET | Freq: Every day | ORAL | 3 refills | Status: DC

## 2023-06-17 ENCOUNTER — Other Ambulatory Visit: Payer: Self-pay

## 2023-06-17 MED ORDER — ATORVASTATIN CALCIUM 20 MG PO TABS: 20.0000 mg | ORAL_TABLET | Freq: Every day | ORAL | 5 refills | Status: DC

## 2023-10-26 ENCOUNTER — Encounter: Payer: Self-pay | Admitting: Cardiology

## 2023-10-26 ENCOUNTER — Ambulatory Visit: Attending: Cardiology | Admitting: Cardiology

## 2023-10-26 VITALS — BP 110/60 | HR 67 | Ht 64.0 in | Wt 194.2 lb

## 2023-10-26 DIAGNOSIS — I2510 Atherosclerotic heart disease of native coronary artery without angina pectoris: Secondary | ICD-10-CM

## 2023-10-26 DIAGNOSIS — I10 Essential (primary) hypertension: Secondary | ICD-10-CM

## 2023-10-26 DIAGNOSIS — Z79899 Other long term (current) drug therapy: Secondary | ICD-10-CM | POA: Diagnosis not present

## 2023-10-26 DIAGNOSIS — E782 Mixed hyperlipidemia: Secondary | ICD-10-CM

## 2023-10-26 NOTE — Addendum Note (Signed)
 Addended byRonald Cockayne on: 10/26/2023 02:59 PM   Modules accepted: Orders

## 2023-10-26 NOTE — Progress Notes (Signed)
 Cardiology Office Note   Date:  10/26/2023  ID:  Deniss, Wormley Jan 20, 1956, MRN [id number] PCP: Comer Decamp, MD  [facility] Providers Cardiologist:  Constancia Delton, MD     History of Present Illness Charlotte Moses is a 68 y.o. female with a past medical history of hypertension, hyperlipidemia, coronary calcification noted on CT scan, former smoker 20+ years, who presents today for follow-up.  Previously underwent echocardiogram 01/04/2022 which revealed an LVEF of 55 to 60%.   She was last seen in clinic 04/17/2023 by Dr.Agbor-Etang.  At that time she denied chest pain or shortness of breath and her blood pressure was adequately controlled.  She had been compliant with her current medication regimen and had no new cardiac concerns.  She was continued on her current medication regimen and no further testing was ordered at that time.  She returns to clinic today with concerns of having issues with her blood pressure.  She stated last week her blood pressure was noted to be elevated and she doubled up her losartan and her HCTZ for 2 days and that her blood pressure had returned back to normal.  She stated she just did not feel well but typically she does not take her pressure on a routine basis.  She stated that she just recently traveled to [place]  for a job and had traveled back without incident.  She is now back on the reduced dosing of her regular prescribed medications.  She denies any chest pain, shortness of breath, dyspnea on exertion, peripheral edema, but occasionally will have palpitations that last only seconds.  States that she has been compliant with her current medication regimen.  Denies any hospitalizations or visits to the emergency department.  ROS: 10 point review of system has been reviewed and considered negative with exception ones were listed in the HPI  Studies Reviewed EKG Interpretation Date/Time:  Monday October 26 2023 14:32:27 EDT Ventricular Rate:   67 PR Interval:  138 QRS Duration:  72 QT Interval:  402 QTC Calculation: 424 R Axis:   -11  Text Interpretation: Normal sinus rhythm Nonspecific T wave abnormality When compared with ECG of 17-Apr-2023 11:36, No significant change was found Confirmed by Ronald Cockayne ([id number]) on 10/26/2023 2:34:51 PM    2D echo 05/16/2022  1. Left ventricular ejection fraction, by estimation, is 55 to 60%. The  left ventricle has normal function. The left ventricle has no regional  wall motion abnormalities. Left ventricular diastolic parameters are  consistent with Grade I diastolic  dysfunction (impaired relaxation).   2. Right ventricular systolic function is normal. The right ventricular  size is normal. There is normal pulmonary artery systolic pressure. The  estimated right ventricular systolic pressure is 28.8 mmHg.   3. The mitral valve is normal in structure. No evidence of mitral valve  regurgitation. No evidence of mitral stenosis.   4. The aortic valve is normal in structure. Aortic valve regurgitation is  not visualized. Aortic valve sclerosis is present, with no evidence of  aortic valve stenosis.   5. The inferior vena cava is normal in size with greater than 50%  respiratory variability, suggesting right atrial pressure of 3 mmHg.  Risk Assessment/Calculations           Physical Exam VS:  BP 110/60 (BP Location: Left Arm, Patient Position: Sitting, Cuff Size: Normal)   Pulse 67   Ht 5\' 4"  (1.626 m)   Wt 194 lb 3.2 oz (88.1 kg)   SpO2 97%  BMI 33.33 kg/m    Wt Readings from Last 3 Encounters:  10/26/23 194 lb 3.2 oz (88.1 kg)  04/17/23 188 lb (85.3 kg)  11/04/22 184 lb (83.5 kg)    GEN: Well nourished, well developed in no acute distress NECK: No JVD; No carotid bruits CARDIAC: RRR, no murmurs, rubs, gallops RESPIRATORY:  Clear to auscultation without rales, wheezing or rhonchi  ABDOMEN: Soft, non-tender, non-distended EXTREMITIES:  No edema; No deformity   ASSESSMENT  AND PLAN Coronary artery disease with three-vessel coronary calcification with a calcium  score 164.  She denies angina or anginal equivalents today.  EKG reveals sinus rhythm with a rate of 67 nonspecific T wave abnormalities with no significant change found from prior studies.  She is continued on aspirin  80 statin 20 mg daily.  No further ischemic testing is needed at this time.  Primary hypertension with a blood pressure today 110/60.  Patient is continue losartan 50 mg daily and HCTZ 12.5 mg daily.  Blood pressure was elevated for 2 days and then with increase in medication and reduction back to original dose blood pressure started back to normal.  She has been encouraged to continue to monitor pressure 1 to 2 hours postmedication administration at home as well.  With taking elevated ARB and thiazide she has been sent for an updated BMP today.  Mixed hyperlipidemia where she had been continued on Lipitor 20 mg daily last LDL was 79.  She has been sent for an updated direct LDL today.  Can titrate Lipitor if LDL continues to remain not at goal of less than 70.       Dispo: Patient return to clinic with MD/APP in 6 months or sooner if needed for further evaluation of symptoms.  Signed, Hazen Brumett, NP

## 2023-10-26 NOTE — Patient Instructions (Signed)
 Medication Instructions:  Your Physician recommend you continue on your current medication as directed.    *If you need a refill on your cardiac medications before your next appointment, please call your pharmacy*  Lab Work: Your provider would like for you to have following labs drawn today lipid direct, bmp.   If you have labs (blood work) drawn today and your tests are completely normal, you will receive your results only by: MyChart Message (if you have MyChart) OR A paper copy in the mail If you have any lab test that is abnormal or we need to change your treatment, we will call you to review the results.  Testing/Procedures: No test ordered today   Follow-Up: At [facility], you and your health needs are our priority.  As part of our continuing mission to provide you with exceptional heart care, our providers are all part of one team.  This team includes your primary Cardiologist (physician) and Advanced Practice Providers or APPs (Physician Assistants and Nurse Practitioners) who all work together to provide you with the care you need, when you need it.  Your next appointment:   6 month(s)  Provider:   Constancia Delton, MD or Ronald Cockayne, NP

## 2023-10-27 ENCOUNTER — Other Ambulatory Visit: Payer: Self-pay

## 2023-10-27 ENCOUNTER — Ambulatory Visit: Payer: Self-pay

## 2023-10-27 LAB — LIPID PANEL
Chol/HDL Ratio: 3.2 ratio (ref 0.0–4.4)
Cholesterol, Total: 190 mg/dL (ref 100–199)
HDL: 59 mg/dL (ref >39)
LDL Chol Calc (NIH): 102 mg/dL — ABNORMAL HIGH (ref 0–99)
Triglycerides: 170 mg/dL — ABNORMAL HIGH (ref 0–149)
VLDL Cholesterol Cal: 29 mg/dL (ref 5–40)

## 2023-10-27 LAB — BASIC METABOLIC PANEL WITH GFR
BUN/Creatinine Ratio: 20 (ref 12–28)
BUN: 16 mg/dL (ref 8–27)
CO2: 23 mmol/L (ref 20–29)
Calcium: 9.7 mg/dL (ref 8.7–10.3)
Chloride: 101 mmol/L (ref 96–106)
Creatinine, Ser: 0.8 mg/dL (ref 0.57–1.00)
Glucose: 103 mg/dL — ABNORMAL HIGH (ref 70–99)
Potassium: 4.3 mmol/L (ref 3.5–5.2)
Sodium: 141 mmol/L (ref 134–144)
eGFR: 80 mL/min/{1.73_m2} (ref >59)

## 2023-10-27 LAB — LDL CHOLESTEROL, DIRECT: LDL Direct: 108 mg/dL — ABNORMAL HIGH (ref 0–99)

## 2023-10-27 MED ORDER — ATORVASTATIN CALCIUM 40 MG PO TABS: 40.0000 mg | ORAL_TABLET | Freq: Every day | ORAL | 3 refills | Status: AC

## 2023-10-27 NOTE — Progress Notes (Signed)
 With elevated direct LDL 108, with a goal of 70 or less, recommend increasing atorvastatin  to 40 mg daily.  Repeat hepatic and lipid panel in 3 months.  Kidney function and electrolytes are stable.

## 2023-10-27 NOTE — Progress Notes (Signed)
 New rx sent due to increase in dosage

## 2023-12-03 ENCOUNTER — Encounter: Payer: Self-pay | Admitting: Obstetrics and Gynecology

## 2023-12-03 ENCOUNTER — Ambulatory Visit: Admitting: Obstetrics and Gynecology

## 2023-12-03 ENCOUNTER — Other Ambulatory Visit (HOSPITAL_COMMUNITY)
Admission: RE | Admit: 2023-12-03 | Discharge: 2023-12-03 | Disposition: A | Discharge status: Home / Self Care | Source: Ambulatory Visit | Attending: Obstetrics and Gynecology | Admitting: Obstetrics and Gynecology

## 2023-12-03 VITALS — BP 135/77 | HR 61 | Ht 64.0 in | Wt 199.0 lb

## 2023-12-03 DIAGNOSIS — Z87410 Personal history of cervical dysplasia: Secondary | ICD-10-CM

## 2023-12-03 DIAGNOSIS — Z01419 Encounter for gynecological examination (general) (routine) without abnormal findings: Secondary | ICD-10-CM

## 2023-12-03 DIAGNOSIS — Z1151 Encounter for screening for human papillomavirus (HPV): Secondary | ICD-10-CM | POA: Diagnosis not present

## 2023-12-03 DIAGNOSIS — Z1272 Encounter for screening for malignant neoplasm of vagina: Secondary | ICD-10-CM

## 2023-12-03 DIAGNOSIS — Z1231 Encounter for screening mammogram for malignant neoplasm of breast: Secondary | ICD-10-CM

## 2023-12-03 DIAGNOSIS — Z124 Encounter for screening for malignant neoplasm of cervix: Secondary | ICD-10-CM

## 2023-12-03 NOTE — Progress Notes (Signed)
 PCP: Tobie Domino, MD   Chief Complaint  Patient presents with   Gynecologic Exam    Hot flashes getting worse lately.     HPI:      [name] is a 68 y.o. G1P0010 whose LMP was No LMP recorded. Patient is postmenopausal., presents today for her Medicare annual examination.  Her menses are absent due to menopause. No PMB. Still gets vasomotor sx, worse a couple months ago but back to normal now.   Sex activity: not currently sexually active, contraception - post menopausal status. She does not have vaginal dryness/itch/d/c.  Last Pap: 11/04/22 Results were LGSIL/neg HPV DNA; repeat pap due in 1 yr per [facility].  10/28/21 Results were: low-grade squamous intraepithelial neoplasia (LGSIL - encompassing HPV,mild dysplasia,CIN I) /POS HPV DNA. Pt declined repeat colpo 2023. Last Pap: 2022. 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 Has appt 10/25 There is no FH of breast cancer. There is no FH of ovarian cancer. The patient does do self-breast exams.  Colonoscopy: 7/23 at [facility] with polyps; Repeat due after 5 years per pt but letter to pt states 7 yrs.  DEXA: normal spine/hip 7/23 at [facility]  Tobacco use: The patient denies current or previous tobacco use. Alcohol use: social drinker Does THC gummies Exercise: min active  She does get adequate calcium  and Vitamin D  in her diet.  Labs with PCP.   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    Past Surgical History:  Procedure Laterality Date   BREAST EXCISIONAL BIOPSY Left 1990   benign   BREAST SURGERY     CATARACT EXTRACTION W/PHACO Left 11/19/2016   Procedure: CATARACT EXTRACTION PHACO AND INTRAOCULAR  LENS PLACEMENT (IOC);  Surgeon: Dingeldein, Steven, MD;  Location: [facility] ORS;  Service: Ophthalmology;  Laterality: Left;  US  00:57 AP% 23.8 CDE 28.01 Fluid pack lot # [id number]   CATARACT EXTRACTION W/PHACO Right 05/13/2017   Procedure: CATARACT EXTRACTION PHACO AND INTRAOCULAR LENS PLACEMENT (IOC)  RIGHT;  Surgeon: Mittie Gaskin, MD;  Location: [facility];  Service: Ophthalmology;  Laterality: Right;   COLONOSCOPY N/A 2008, 2011   COLONOSCOPY WITH PROPOFOL  N/A 05/30/2015   Procedure: COLONOSCOPY WITH PROPOFOL ;  Surgeon: Reyes LELON Cota, MD;  Location: [facility] ENDOSCOPY;  Service: Endoscopy;  Laterality: N/A;   COLONOSCOPY WITH PROPOFOL  N/A 12/13/2021   Procedure: COLONOSCOPY WITH PROPOFOL ;  Surgeon: Therisa Bi, MD;  Location: [facility] END[facility];  Service: Gastroenterology;  Laterality: N/A;   EYE SURGERY Bilateral ~10 years ago   corrective   HEMORROIDECTOMY  07/30/2006   External hemorrhoid removed.   KNEE ARTHROSCOPY WITH MEDIAL MENISECTOMY Left 03/19/2017   Procedure: KNEE ARTHROSCOPY WITH MEDIAL MENISECTOMY, LIMITED SYNOVECTOMY, CHONDROPLASTY, EXCISION OF OSTEOPHYTES;  Surgeon: Marchia Drivers, MD;  Location: [facility] ORS;  Service: Orthopedics;  Laterality: Left;   KNEE SURGERY Left ~25 years ago   LAPAROSCOPIC GASTRIC SLEEVE RESECTION N/A 09/05/2013   Procedure: LAPAROSCOPIC GASTRIC SLEEVE RESECTION and REPAIR OF HIATAL HERNIA, upper endoscopy;  Surgeon: Camellia CHRISTELLA Blush, MD;  Location: [facility] ORS;  Service: General;  Laterality: N/A;   LIPOSUCTION     POLYPECTOMY  2008   benign   VEIN SURGERY Right more than  10 years ago   varicose veins    Family History  Problem Relation Age of Onset   Lymphoma Mother    Arthritis Father    Hyperlipidemia Other    Hypertension Other    Obesity Other    Ulcers Other    Pancreatic cancer Paternal Grandfather 83   Breast cancer Neg Hx     Social History   Socioeconomic History   Marital status: Single    Spouse name: Not on file    Number of children: Not on file   Years of education: Not on file   Highest education level: Not on file  Occupational History   Not on file  Tobacco Use   Smoking status: Former    Current packs/day: 0.00    Average packs/day: 1 pack/day for 20.0 years (20.0 ttl pk-yrs)    Types: Cigarettes    Start date: 05/26/1968    Quit date: 05/26/1988    Years since quitting: 35.5   Smokeless tobacco: Never  Vaping Use   Vaping status: Never Used  Substance and Sexual Activity   Alcohol use: Yes    Comment: occasional   Drug use: Yes    Types: Marijuana    Comment: gummies   Sexual activity: Not Currently    Birth control/protection: Post-menopausal  Other Topics Concern   Not on file  Social History Narrative   Not on file   Social Drivers of Health   Financial Resource Strain: Not on file  Food Insecurity: Not on file  Transportation Needs: Not on file  Physical Activity: Sufficiently Active (07/15/2017)   Exercise Vital Sign    Days of Exercise per Week: 5 days    Minutes of Exercise per Session: 30 min  Stress: No Stress Concern Present (07/15/2017)   [employer] of Occupational Health - Occupational Stress Questionnaire    Feeling of Stress : Not at all  Social Connections: Somewhat Isolated (07/15/2017)   Social Connection and Isolation Panel    Frequency of Communication with Friends and Family: More than three times a week    Frequency of Social Gatherings with Friends and Family: More than three times a week    Attends Religious Services: 1 to 4 times per year    Active Member of [employer] or Organizations: No    Attends Banker Meetings: Never    Marital Status: Never married  Intimate Partner Violence: Not At Risk (07/15/2017)   Humiliation, Afraid, Rape, and Kick questionnaire    Fear of Current or Ex-Partner: No    Emotionally Abused: No    Physically Abused: No    Sexually Abused: No     Current Outpatient Medications:    aspirin  EC 81 MG tablet,  Take 1 tablet (81 mg total) by mouth daily. Swallow whole., Disp: 90 tablet, Rfl: 3   atorvastatin  (LIPITOR) 40 MG tablet, Take 1 tablet (40 mg total) by mouth daily., Disp: 90 tablet, Rfl: 3   Calcium  Citrate-Vitamin D  (CALCIUM  + D PO), Take 1 tablet by mouth daily., Disp: , Rfl:    Cyanocobalamin (B-12 SL), Place 1 tablet under the tongue daily., Disp: , Rfl:    hydrochlorothiazide  (HYDRODIURIL ) 25 MG tablet, Take 0.5 tablets (12.5 mg total) by mouth daily., Disp: 45 tablet, Rfl: 3   losartan (COZAAR) 100 MG tablet, Take 100 mg by mouth daily., Disp: , Rfl:    Multiple Vitamin (MULTIVITAMIN WITH MINERALS) TABS tablet, Take 1 tablet by mouth daily., Disp: , Rfl:  naproxen sodium (ANAPROX) 220 MG tablet, Take 220-440 mg by mouth 2 (two) times daily as needed (pain). , Disp: , Rfl:    PARoxetine (PAXIL) 10 MG tablet, Take 10 mg by mouth daily., Disp: , Rfl:    PARoxetine (PAXIL) 20 MG tablet, Take 20 mg by mouth daily. (Patient not taking: Reported on 12/03/2023), Disp: , Rfl:      ROS:  Review of Systems  Constitutional:  Negative for fatigue, fever and unexpected weight change.  Respiratory:  Negative for cough, shortness of breath and wheezing.   Cardiovascular:  Negative for chest pain, palpitations and leg swelling.  Gastrointestinal:  Negative for blood in stool, constipation, diarrhea, nausea and vomiting.  Endocrine: Negative for cold intolerance, heat intolerance and polyuria.  Genitourinary:  Negative for dyspareunia, dysuria, flank pain, frequency, genital sores, hematuria, menstrual problem, pelvic pain, urgency, vaginal bleeding, vaginal discharge and vaginal pain.  Musculoskeletal:  Negative for back pain, joint swelling and myalgias.  Skin:  Negative for rash.  Neurological:  Negative for dizziness, syncope, light-headedness, numbness and headaches.  Hematological:  Negative for adenopathy.  Psychiatric/Behavioral:  Positive for dysphoric mood. Negative for agitation,  confusion, sleep disturbance and suicidal ideas. The patient is not nervous/anxious.    BREAST: No symptoms    Objective: BP 135/77   Pulse 61   Ht 5' 4 (1.626 m)   Wt 199 lb (90.3 kg)   BMI 34.16 kg/m    Physical Exam Constitutional:      Appearance: She is well-developed.  Genitourinary:     Vulva normal.     Right Labia: No rash, tenderness or lesions.    Left Labia: No tenderness, lesions or rash.    No vaginal discharge, erythema or tenderness.      Right Adnexa: not tender and no mass present.    Left Adnexa: not tender and no mass present.    No cervical friability or polyp.     Uterus is not enlarged or tender.  Breasts:    Right: No mass, nipple discharge, skin change or tenderness.     Left: No mass, nipple discharge, skin change or tenderness.  Neck:     Thyroid : No thyromegaly.  Cardiovascular:     Rate and Rhythm: Normal rate and regular rhythm.     Heart sounds: Normal heart sounds. No murmur heard. Pulmonary:     Effort: Pulmonary effort is normal.     Breath sounds: Normal breath sounds.  Abdominal:     Palpations: Abdomen is soft.     Tenderness: There is no abdominal tenderness. There is no guarding or rebound.  Musculoskeletal:        General: Normal range of motion.     Cervical back: Normal range of motion.  Lymphadenopathy:     Cervical: No cervical adenopathy.  Neurological:     General: No focal deficit present.     Mental Status: She is alert and oriented to person, place, and time.     Cranial Nerves: No cranial nerve deficit.  Skin:    General: Skin is warm and dry.  Psychiatric:        Mood and Affect: Mood normal.        Behavior: Behavior normal.        Thought Content: Thought content normal.        Judgment: Judgment normal.  Vitals reviewed.     Assessment/Plan:  Encounter for annual routine gynecological examination  Cervical cancer screening - Plan: Cytology - PAP  Screening for HPV (human papillomavirus) - Plan:  Cytology - PAP  History of cervical dysplasia - Plan: Cytology - PAP; repeat pap today  Encounter for screening mammogram for malignant neoplasm of breast; pt has mammo appt at [facility]          GYN counsel breast self exam, mammography screening, adequate intake of calcium  and vitamin D , diet and exercise    F/U  Return in about 1 year (around 12/02/2024).  Mcdonald Reiling B. Jeronimo Hellberg, PA-C 12/03/2023 1:59 PM

## 2023-12-03 NOTE — Patient Instructions (Signed)
 I value your feedback and you entrusting [place] with your care. If you get a [place] patient survey, I would appreciate you taking the time to let us know about your experience today. Thank you! ? ? ?

## 2023-12-10 ENCOUNTER — Ambulatory Visit: Payer: Self-pay | Admitting: Obstetrics and Gynecology

## 2023-12-10 LAB — CYTOLOGY - PAP
Adequacy: ABSENT
Comment: NEGATIVE
High risk HPV: NEGATIVE

## 2024-03-21 LAB — HM MAMMOGRAPHY

## 2024-03-22 ENCOUNTER — Encounter: Payer: Self-pay | Admitting: Obstetrics and Gynecology

## 2024-04-15 ENCOUNTER — Encounter: Payer: Self-pay | Admitting: Obstetrics and Gynecology

## 2024-05-12 ENCOUNTER — Encounter: Payer: Self-pay | Admitting: Cardiology

## 2024-05-12 ENCOUNTER — Ambulatory Visit: Attending: Cardiology | Admitting: Cardiology

## 2024-05-12 VITALS — BP 128/70 | HR 53 | Ht 64.0 in | Wt 201.6 lb

## 2024-05-12 DIAGNOSIS — E782 Mixed hyperlipidemia: Secondary | ICD-10-CM | POA: Diagnosis not present

## 2024-05-12 DIAGNOSIS — I10 Essential (primary) hypertension: Secondary | ICD-10-CM

## 2024-05-12 DIAGNOSIS — I2510 Atherosclerotic heart disease of native coronary artery without angina pectoris: Secondary | ICD-10-CM

## 2024-05-12 DIAGNOSIS — Z79899 Other long term (current) drug therapy: Secondary | ICD-10-CM

## 2024-05-12 MED ORDER — HYDROCHLOROTHIAZIDE 25 MG PO TABS: 12.5000 mg | ORAL_TABLET | Freq: Every day | ORAL | 3 refills | Status: AC

## 2024-05-12 NOTE — Progress Notes (Signed)
 Cardiology Office Note   Date:  05/12/2024  ID:  Charlotte Moses, Charlotte Moses [birth year], MRN [id number] PCP: Tobie Domino, MD  [facility] Providers Cardiologist:  Redell Cave, MD     History of Present Illness Charlotte Moses is a 68 y.o. female with past medical history of hypertension, hyperlipidemia, coronary calcification on CT scan, former smoker 20+ years, who presents today for follow-up.   Previously underwent echocardiogram in 01/04/2022 which revealed an LVEF 55 to 60%.  She was evaluated in clinic 04/17/2023 by Dr. Cave.  She denies any complaints of chest discomfort or shortness of breath.  Been compliant with her current medication regimen.  Last seen in clinic 10/26/2023 with concerns of having issues with her blood pressure.  Blood pressure had been elevated and she doubled up on her losartan HCTZ for 2 days and her blood pressure returned back to normal.  She states she does not feel well and typically she does not take her pressure at home on a routine basis.  Medication to return to baseline with resolution of elevated blood pressure.  There were no other changes that were made and no further testing that was ordered.    She returns to clinic today states that she has been doing well with cardiac perspective.  She denies any chest pain, shortness of breath, dyspnea on exertion or peripheral edema.  States that she has been compliant with her current medication regimen.  Has not had any further issues with her blood pressure.  Denies any hospitalizations or visits to the emergency department.  ROS: 10 point review of system has been reviewed and considered negative exception was been listed in the HPI  Studies Reviewed EKG Interpretation Date/Time:  Thursday May 12 2024 15:10:06 EST Ventricular Rate:  53 PR Interval:  158 QRS Duration:  70 QT Interval:  458 QTC Calculation: 429 R Axis:   18  Text Interpretation: Sinus bradycardia Nonspecific T wave  abnormality When compared with ECG of 26-Oct-2023 14:32, No significant change was found Confirmed by Gerard Frederick ([id number]) on 05/12/2024 3:21:00 PM    2D echo 05/16/2022  1. Left ventricular ejection fraction, by estimation, is 55 to 60%. The  left ventricle has normal function. The left ventricle has no regional  wall motion abnormalities. Left ventricular diastolic parameters are  consistent with Grade I diastolic  dysfunction (impaired relaxation).   2. Right ventricular systolic function is normal. The right ventricular  size is normal. There is normal pulmonary artery systolic pressure. The  estimated right ventricular systolic pressure is 28.8 mmHg.   3. The mitral valve is normal in structure. No evidence of mitral valve  regurgitation. No evidence of mitral stenosis.   4. The aortic valve is normal in structure. Aortic valve regurgitation is  not visualized. Aortic valve sclerosis is present, with no evidence of  aortic valve stenosis.   5. The inferior vena cava is normal in size with greater than 50%  respiratory variability, suggesting right atrial pressure of 3 mmHg.   Risk Assessment/Calculations           Physical Exam VS:  BP 128/70 (BP Location: Left Arm, Patient Position: Sitting, Cuff Size: Normal)   Pulse (!) 53 Comment: 60 oximeter  Ht 5' 4 (1.626 m)   Wt 201 lb 9.6 oz (91.4 kg)   SpO2 96%   BMI 34.60 kg/m        Wt Readings from Last 3 Encounters:  05/12/24 201 lb 9.6 oz (91.4 kg)  12/03/23 199 lb (90.3 kg)  10/26/23 194 lb 3.2 oz (88.1 kg)    GEN: Well nourished, well developed in no acute distress NECK: No JVD; No carotid bruits CARDIAC: RRR, no murmurs, rubs, gallops RESPIRATORY:  Clear to auscultation without rales, wheezing or rhonchi  ABDOMEN: Soft, non-tender, non-distended EXTREMITIES:  No edema; No deformity   ASSESSMENT AND PLAN Coronary artery disease with three-vessel coronary calcification with a calcium  score of 164.  She denies any  anginal complaints.  EKG today reveals sinus bradycardia with rate of 53 with nonspecific T wave abnormalities with no significant changes from prior studies.  She is continued on aspirin  81 mg daily and atorvastatin  40 mg daily.  No further ischemic evaluation needed at this time.  Primary hypertension with a blood pressure today 128/70.  Blood pressure has been well-controlled.  She has been continued on HCTZ 12.5 mg daily and losartan 100 mg daily.  She has been encouraged to continue to monitor her pressure 1 to 2 hours postmedication administration at home.  Mixed hyperlipidemia where atorvastatin  was recently increased to 40 mg daily and she was to repeat lipid and hepatic panel which is yet to be completed.  They were reordered today and she is advised to get them done within the next few weeks.  At this time she is continued on atorvastatin  40 mg daily with the goal of her LDL being less than 70.       Dispo: Patient to return to clinic to see MD/APP in 6 months or sooner if needed for further evaluation.  Signed, Roxanna Mcever, NP

## 2024-05-12 NOTE — Patient Instructions (Signed)
 Medication Instructions:  Your physician recommends that you continue on your current medications as directed. Please refer to the Current Medication list given to you today.   *If you need a refill on your cardiac medications before your next appointment, please call your pharmacy*  Lab Work: Your provider would like for you to return in next weeks to have the following labs drawn: Lipid, Hepatic Panel.   Please go to [facility] [address] ([facility] [address], [place] [address] You do not need an appointment.  They are open from 8 am- 4:30 pm.  Lunch from 1:00 pm- 2:00 pm You DO need to be fasting.  If you have labs (blood work) drawn today and your tests are completely normal, you will receive your results only by: MyChart Message (if you have MyChart) OR A paper copy in the mail If you have any lab test that is abnormal or we need to change your treatment, we will call you to review the results.  Testing/Procedures: No test ordered today   Follow-Up: At [facility], you and your health needs are our priority.  As part of our continuing mission to provide you with exceptional heart care, our providers are all part of one team.  This team includes your primary Cardiologist (physician) and Advanced Practice Providers or APPs (Physician Assistants and Nurse Practitioners) who all work together to provide you with the care you need, when you need it.  Your next appointment:   6 month(s)  Provider:   You may see Redell Cave, MD or one of the following Advanced Practice Providers on your designated Care Team:   Tylene Lunch, NP

## 2024-05-25 LAB — HEPATIC FUNCTION PANEL
ALT: 13 IU/L (ref 0–32)
AST: 19 IU/L (ref 0–40)
Albumin: 4.4 g/dL (ref 3.9–4.9)
Alkaline Phosphatase: 105 IU/L (ref 49–135)
Bilirubin Total: 0.7 mg/dL (ref 0.0–1.2)
Bilirubin, Direct: 0.22 mg/dL (ref 0.00–0.40)
Total Protein: 6.6 g/dL (ref 6.0–8.5)

## 2024-05-25 LAB — LIPID PANEL
Chol/HDL Ratio: 3 ratio (ref 0.0–4.4)
Cholesterol, Total: 188 mg/dL (ref 100–199)
HDL: 63 mg/dL
LDL Chol Calc (NIH): 107 mg/dL — ABNORMAL HIGH (ref 0–99)
Triglycerides: 100 mg/dL (ref 0–149)
VLDL Cholesterol Cal: 18 mg/dL (ref 5–40)

## 2024-05-26 ENCOUNTER — Ambulatory Visit: Payer: Self-pay | Admitting: Cardiology

## 2024-05-26 DIAGNOSIS — E782 Mixed hyperlipidemia: Secondary | ICD-10-CM

## 2024-05-26 NOTE — Progress Notes (Signed)
 Liver functions remain stable.  Even with increasing atorvastatin  to 40 mg daily LDL continues to increase.  LDL was 107.  Goal is 70 or less.  Recommend starting ezetimibe 10 mg daily in addition to atorvastatin  and repeat lipid and hepatic function in 10 to 12 weeks.

## 2024-05-27 MED ORDER — EZETIMIBE 10 MG PO TABS: 10.0000 mg | ORAL_TABLET | Freq: Every day | ORAL | 3 refills | Status: AC

## 2024-11-10 ENCOUNTER — Ambulatory Visit: Admitting: Cardiology
